# Patient Record
Sex: Male | Born: 1985 | Race: Black or African American | Hispanic: No | Marital: Single | Smoking: Current every day smoker
Health system: Southern US, Community
[De-identification: ages and names within clinical notes are randomized; demographics above are authoritative.]

---
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
## 2004-11-04 ENCOUNTER — Emergency Department (HOSPITAL_COMMUNITY): Admission: EM | Admit: 2004-11-04 | Discharge: 2004-11-04 | Payer: Self-pay | Admitting: Emergency Medicine

## 2004-11-22 ENCOUNTER — Emergency Department (HOSPITAL_COMMUNITY): Admission: EM | Admit: 2004-11-22 | Discharge: 2004-11-22 | Payer: Self-pay | Admitting: Emergency Medicine

## 2006-09-10 ENCOUNTER — Encounter: Payer: Self-pay | Admitting: Emergency Medicine

## 2006-09-10 ENCOUNTER — Inpatient Hospital Stay (HOSPITAL_COMMUNITY): Admission: EM | Admit: 2006-09-10 | Discharge: 2006-09-11 | Payer: Self-pay | Admitting: Emergency Medicine

## 2006-09-14 ENCOUNTER — Inpatient Hospital Stay (HOSPITAL_COMMUNITY): Admission: AD | Admit: 2006-09-14 | Discharge: 2006-09-17 | Payer: Self-pay | Admitting: Psychiatry

## 2006-09-14 ENCOUNTER — Ambulatory Visit: Payer: Self-pay | Admitting: Psychiatry

## 2006-09-15 ENCOUNTER — Ambulatory Visit (HOSPITAL_COMMUNITY): Admission: RE | Admit: 2006-09-15 | Discharge: 2006-09-15 | Payer: Self-pay | Admitting: Psychiatry

## 2006-11-28 ENCOUNTER — Emergency Department (HOSPITAL_COMMUNITY): Admission: EM | Admit: 2006-11-28 | Discharge: 2006-11-28 | Payer: Self-pay | Admitting: Emergency Medicine

## 2007-06-16 ENCOUNTER — Emergency Department (HOSPITAL_COMMUNITY): Admission: EM | Admit: 2007-06-16 | Discharge: 2007-06-16 | Payer: Self-pay | Admitting: Emergency Medicine

## 2008-04-11 ENCOUNTER — Emergency Department (HOSPITAL_COMMUNITY): Admission: EM | Admit: 2008-04-11 | Discharge: 2008-04-11 | Payer: Self-pay | Admitting: Emergency Medicine

## 2008-04-30 ENCOUNTER — Emergency Department: Payer: Self-pay | Admitting: Emergency Medicine

## 2008-10-12 ENCOUNTER — Emergency Department (HOSPITAL_COMMUNITY): Admission: EM | Admit: 2008-10-12 | Discharge: 2008-10-12 | Payer: Self-pay | Admitting: Emergency Medicine

## 2008-12-30 ENCOUNTER — Emergency Department (HOSPITAL_COMMUNITY): Admission: EM | Admit: 2008-12-30 | Discharge: 2008-12-30 | Payer: Self-pay | Admitting: Emergency Medicine

## 2009-10-28 ENCOUNTER — Emergency Department (HOSPITAL_COMMUNITY): Admission: EM | Admit: 2009-10-28 | Discharge: 2009-10-29 | Payer: Self-pay | Admitting: Emergency Medicine

## 2010-05-21 LAB — CULTURE, ROUTINE-ABSCESS

## 2010-05-21 LAB — RPR: RPR Ser Ql: NONREACTIVE

## 2010-05-21 LAB — HIV ANTIBODY (ROUTINE TESTING W REFLEX): HIV: NONREACTIVE

## 2010-06-22 ENCOUNTER — Emergency Department (HOSPITAL_COMMUNITY)
Admission: EM | Admit: 2010-06-22 | Discharge: 2010-06-22 | Disposition: A | Discharge status: Home / Self Care | Payer: Self-pay | Attending: Emergency Medicine | Admitting: Emergency Medicine

## 2010-06-22 DIAGNOSIS — Z23 Encounter for immunization: Secondary | ICD-10-CM | POA: Insufficient documentation

## 2010-06-22 DIAGNOSIS — IMO0001 Reserved for inherently not codable concepts without codable children: Secondary | ICD-10-CM | POA: Insufficient documentation

## 2010-06-22 DIAGNOSIS — L02619 Cutaneous abscess of unspecified foot: Secondary | ICD-10-CM | POA: Insufficient documentation
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
## 2010-12-01 LAB — CBC
HCT: 44.9
HCT: 45.3
Hemoglobin: 15
Hemoglobin: 14.9
MCHC: 33.3
MCHC: 32.7
MCHC: 32.8
MCV: 83.2
MCV: 84.1
MCV: 84.3
Platelets: 253
Platelets: 185
RBC: 5.4
RBC: 5.39
RDW: 12.6
RDW: 13.6
RDW: 13.8
WBC: 11.5 — ABNORMAL HIGH
WBC: 17.7 — ABNORMAL HIGH

## 2010-12-01 LAB — OPIATE, QUANTITATIVE, URINE
Codeine Urine: NEGATIVE
Hydrocodone: 850 ng/mL
Hydromorphone GC/MS Conf: 200 ng/mL
Morphine, Confirm: NEGATIVE
Oxycodone, ur: NEGATIVE

## 2010-12-01 LAB — COMPREHENSIVE METABOLIC PANEL
ALT: 15
AST: 22
Albumin: 3.9
Alkaline Phosphatase: 85
BUN: 12
CO2: 27
Calcium: 9.2
Chloride: 103
Creatinine, Ser: 1.28
GFR calc Af Amer: >60
GFR calc non Af Amer: >60
Glucose, Bld: 90
Potassium: 4.4
Sodium: 137
Total Bilirubin: 0.7
Total Protein: 7.8

## 2010-12-01 LAB — BASIC METABOLIC PANEL
BUN: 8
BUN: 8
CO2: 27
CO2: 28
Calcium: 8.7
Calcium: 8.9
Chloride: 104
Chloride: 106
Creatinine, Ser: 1.01
Creatinine, Ser: 1.02
GFR calc Af Amer: >60
GFR calc Af Amer: >60
GFR calc non Af Amer: >60
GFR calc non Af Amer: >60
Glucose, Bld: 87
Glucose, Bld: 89
Potassium: 4
Potassium: 4.4
Sodium: 138
Sodium: 138

## 2010-12-01 LAB — URINALYSIS, ROUTINE W REFLEX MICROSCOPIC
Bilirubin Urine: NEGATIVE
Glucose, UA: NEGATIVE
Hgb urine dipstick: NEGATIVE
Ketones, ur: NEGATIVE
Nitrite: NEGATIVE
Protein, ur: NEGATIVE
Specific Gravity, Urine: 1.027
Urobilinogen, UA: 0.2
pH: 6.5

## 2010-12-01 LAB — DRUGS OF ABUSE SCREEN W/O ALC, ROUTINE URINE
Amphetamine Screen, Ur: NEGATIVE
Barbiturate Quant, Ur: NEGATIVE
Benzodiazepines.: NEGATIVE
Cocaine Metabolites: NEGATIVE
Creatinine,U: 160.4
Marijuana Metabolite: POSITIVE — AB
Methadone: NEGATIVE
Opiate Screen, Urine: POSITIVE — AB
Phencyclidine (PCP): NEGATIVE
Propoxyphene: NEGATIVE

## 2010-12-01 LAB — PROTIME-INR
INR: 1
Prothrombin Time: 13.7

## 2010-12-01 LAB — URINE MICROSCOPIC-ADD ON

## 2010-12-01 LAB — TSH: TSH: 1.478

## 2010-12-01 LAB — THC (MARIJUANA), URINE, CONFIRMATION: Marijuana, Ur-Confirmation: 420 ng/mL

## 2010-12-01 LAB — MAGNESIUM: Magnesium: 2.3

## 2015-04-05 ENCOUNTER — Emergency Department (HOSPITAL_COMMUNITY): Payer: Self-pay

## 2015-04-05 ENCOUNTER — Encounter (HOSPITAL_COMMUNITY): Payer: Self-pay

## 2015-04-05 ENCOUNTER — Emergency Department (HOSPITAL_COMMUNITY)
Admission: EM | Admit: 2015-04-05 | Discharge: 2015-04-05 | Disposition: A | Discharge status: Home / Self Care | Payer: Self-pay | Attending: Emergency Medicine | Admitting: Emergency Medicine

## 2015-04-05 DIAGNOSIS — Y9389 Activity, other specified: Secondary | ICD-10-CM | POA: Insufficient documentation

## 2015-04-05 DIAGNOSIS — R55 Syncope and collapse: Secondary | ICD-10-CM | POA: Insufficient documentation

## 2015-04-05 DIAGNOSIS — S199XXA Unspecified injury of neck, initial encounter: Secondary | ICD-10-CM | POA: Insufficient documentation

## 2015-04-05 DIAGNOSIS — Y9289 Other specified places as the place of occurrence of the external cause: Secondary | ICD-10-CM | POA: Insufficient documentation

## 2015-04-05 DIAGNOSIS — S0219XA Other fracture of base of skull, initial encounter for closed fracture: Secondary | ICD-10-CM

## 2015-04-05 DIAGNOSIS — Y998 Other external cause status: Secondary | ICD-10-CM | POA: Insufficient documentation

## 2015-04-05 DIAGNOSIS — S0285XA Fracture of orbit, unspecified, initial encounter for closed fracture: Secondary | ICD-10-CM

## 2015-04-05 DIAGNOSIS — S0282XA Fracture of other specified skull and facial bones, left side, initial encounter for closed fracture: Secondary | ICD-10-CM | POA: Insufficient documentation

## 2015-04-05 DIAGNOSIS — F17200 Nicotine dependence, unspecified, uncomplicated: Secondary | ICD-10-CM | POA: Insufficient documentation

## 2015-04-05 LAB — COMPREHENSIVE METABOLIC PANEL
ALK PHOS: 73 U/L (ref 38–126)
ALT: 15 U/L — AB (ref 17–63)
AST: 24 U/L (ref 15–41)
Albumin: 4.1 g/dL (ref 3.5–5.0)
Anion gap: 8 (ref 5–15)
BILIRUBIN TOTAL: 0.3 mg/dL (ref 0.3–1.2)
BUN: 12 mg/dL (ref 6–20)
CALCIUM: 8.5 mg/dL — AB (ref 8.9–10.3)
CHLORIDE: 106 mmol/L (ref 101–111)
CO2: 26 mmol/L (ref 22–32)
CREATININE: 0.94 mg/dL (ref 0.61–1.24)
GFR calc Af Amer: >60 mL/min (ref >60)
Glucose, Bld: 82 mg/dL (ref 65–99)
Potassium: 4.2 mmol/L (ref 3.5–5.1)
Sodium: 140 mmol/L (ref 135–145)
Total Protein: 7.7 g/dL (ref 6.5–8.1)

## 2015-04-05 LAB — CBC WITH DIFFERENTIAL/PLATELET
Basophils Absolute: 0.1 10*3/uL (ref 0.0–0.1)
Basophils Relative: 0 %
EOS ABS: 0.4 10*3/uL (ref 0.0–0.7)
EOS PCT: 3 %
HCT: 45.8 % (ref 39.0–52.0)
Hemoglobin: 14.9 g/dL (ref 13.0–17.0)
LYMPHS ABS: 3.8 10*3/uL (ref 0.7–4.0)
Lymphocytes Relative: 24 %
MCH: 27.1 pg (ref 26.0–34.0)
MCHC: 32.5 g/dL (ref 30.0–36.0)
MCV: 83.3 fL (ref 78.0–100.0)
Monocytes Absolute: 1.7 10*3/uL — ABNORMAL HIGH (ref 0.1–1.0)
Monocytes Relative: 11 %
Neutro Abs: 9.6 10*3/uL — ABNORMAL HIGH (ref 1.7–7.7)
Neutrophils Relative %: 62 %
PLATELETS: 295 10*3/uL (ref 150–400)
RBC: 5.5 MIL/uL (ref 4.22–5.81)
RDW: 14 % (ref 11.5–15.5)
WBC: 15.6 10*3/uL — AB (ref 4.0–10.5)

## 2015-04-05 LAB — LIPASE, BLOOD: LIPASE: 22 U/L (ref 11–51)

## 2015-04-05 LAB — CBG MONITORING, ED: GLUCOSE-CAPILLARY: 92 mg/dL (ref 65–99)

## 2015-04-05 MED ORDER — CYCLOBENZAPRINE HCL 5 MG PO TABS: 5.0000 mg | ORAL_TABLET | Freq: Three times a day (TID) | ORAL | Status: DC | PRN

## 2015-04-05 MED ORDER — AMOXICILLIN 250 MG PO CAPS
500.0000 mg | ORAL_CAPSULE | Freq: Once | ORAL | Status: AC
  Administered 2015-04-05: 500 mg via ORAL
  Filled 2015-04-05: qty 2

## 2015-04-05 MED ORDER — TRAMADOL HCL 50 MG PO TABS: 100.0000 mg | ORAL_TABLET | Freq: Four times a day (QID) | ORAL | Status: DC | PRN

## 2015-04-05 MED ORDER — ACETAMINOPHEN 500 MG PO TABS
1000.0000 mg | ORAL_TABLET | Freq: Once | ORAL | Status: AC
  Administered 2015-04-05: 1000 mg via ORAL
  Filled 2015-04-05: qty 2

## 2015-04-05 MED ORDER — NAPROXEN 500 MG PO TABS: ORAL_TABLET | ORAL | Status: DC

## 2015-04-05 MED ORDER — AMOXICILLIN 500 MG PO CAPS: 500.0000 mg | ORAL_CAPSULE | Freq: Three times a day (TID) | ORAL | Status: DC

## 2015-04-05 NOTE — ED Notes (Signed)
Attempted to call pt residence. No answer. Pt reported wanted to call a cab. NT calling cab for patient at this time.
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
## 2015-04-05 NOTE — ED Notes (Signed)
Placed ice pack on patient's left eye. Patient tolerated well. Patient went back to sleep after applying ice pack.

## 2015-04-05 NOTE — ED Notes (Signed)
Lab at bedside

## 2015-04-05 NOTE — Discharge Instructions (Signed)
Use ice packs to the painful, bruised, or swollen areas. Take the antibiotic as written until gone. Take the other medications for pain and soreness. You need to follow-up with a ophthalmologist in the ears nose and throat physician. I gave you the names and numbers of the doctors on call tonight. Return to the emergency room if you have any problems listed on the head injury sheet, he starts struggling to breathe, he start having vomiting or vomiting blood, passed blood from your rectum, or get worsening chest or abdominal pain. The fracture around your eye extends from the fracture of year sinus in your forehead and is on the inner side of your eye towards her nose. The discharge instructions talk about a fracture on the floor of the orbit however your symptoms can be similar with getting double vision when you look to your left or right.
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
## 2016-05-22 ENCOUNTER — Emergency Department (HOSPITAL_COMMUNITY)
Admission: EM | Admit: 2016-05-22 | Discharge: 2016-05-22 | Disposition: A | Discharge status: Home / Self Care | Payer: Self-pay | Attending: Emergency Medicine | Admitting: Emergency Medicine

## 2016-05-22 ENCOUNTER — Encounter (HOSPITAL_COMMUNITY): Payer: Self-pay

## 2016-05-22 DIAGNOSIS — F17200 Nicotine dependence, unspecified, uncomplicated: Secondary | ICD-10-CM | POA: Insufficient documentation

## 2016-05-22 DIAGNOSIS — L0231 Cutaneous abscess of buttock: Secondary | ICD-10-CM | POA: Insufficient documentation

## 2016-05-22 DIAGNOSIS — Z79899 Other long term (current) drug therapy: Secondary | ICD-10-CM | POA: Insufficient documentation

## 2016-05-22 MED ORDER — POVIDONE-IODINE 10 % EX SOLN
CUTANEOUS | Status: AC
  Filled 2016-05-22: qty 118

## 2016-05-22 MED ORDER — SULFAMETHOXAZOLE-TRIMETHOPRIM 800-160 MG PO TABS: 1.0000 | ORAL_TABLET | Freq: Two times a day (BID) | ORAL | 0 refills | Status: AC

## 2016-05-22 MED ORDER — LIDOCAINE HCL (PF) 2 % IJ SOLN
INTRAMUSCULAR | Status: AC
  Filled 2016-05-22: qty 10

## 2016-05-22 MED ORDER — HYDROCODONE-ACETAMINOPHEN 5-325 MG PO TABS: ORAL_TABLET | ORAL | 0 refills | Status: DC

## 2016-05-22 NOTE — ED Triage Notes (Signed)
Pt reports he has a boil on right inner buttocks for 2 days. States he had one in same area a year ago. No drainage

## 2016-05-22 NOTE — Discharge Instructions (Signed)
Warm water soaks 2-3 times a day.  You can remove the packing in two days.  Return here if you do not feel comfortable removing it yourself or you are having any worsening symptoms.

## 2016-05-22 NOTE — ED Provider Notes (Signed)
[facility]-EMERGENCY DEPT Provider Note   CSN: [id number] Arrival date & time: 05/22/16  0809     History   Chief Complaint Chief Complaint  Patient presents with  . Abscess    HPI Jesse Reynolds is a 30 y.o. male.  HPI   Jesse Reynolds is a 31 y.o. male who presents to the Emergency Department complaining of a recurrent boil to the left upper buttock.  He complains of pain and swelling to the area for 2 days.  Pain worse with sitting.  He describes some intermittent drainage from the area.  Also reports having recurrent boils to his buttocks.  Pain worse with sitting.  He denies fever, chills, abdominal pain, vomiting, or changes and pain with defecation.   History reviewed. No pertinent past medical history.  There are no active problems to display for this patient.   History reviewed. No pertinent surgical history.     Home Medications    Prior to Admission medications   Medication Sig Start Date End Date Taking? Authorizing Provider  amoxicillin (AMOXIL) 500 MG capsule Take 1 capsule (500 mg total) by mouth 3 (three) times daily. 04/05/15   Devoria Albe, MD  cyclobenzaprine (FLEXERIL) 5 MG tablet Take 1 tablet (5 mg total) by mouth 3 (three) times daily as needed (muscle pain and soreness). 04/05/15   Devoria Albe, MD  naproxen (NAPROSYN) 500 MG tablet Take 1 po BID with food prn pain 04/05/15   Devoria Albe, MD  traMADol (ULTRAM) 50 MG tablet Take 2 tablets (100 mg total) by mouth every 6 (six) hours as needed. 04/05/15   Devoria Albe, MD    Family History History reviewed. No pertinent family history.  Social History Social History  Substance Use Topics  . Smoking status: Current Every Day Smoker  . Smokeless tobacco: Never Used  . Alcohol use Yes     Comment: occassional beer      Allergies   Patient has no known allergies.   Review of Systems Review of Systems  Constitutional: Negative for chills and fever.  Gastrointestinal: Negative for abdominal distention,  abdominal pain, nausea and vomiting.  Genitourinary: Negative for difficulty urinating and dysuria.  Musculoskeletal: Negative for arthralgias.  Skin: Positive for color change.       Pain, redness to left buttock  Hematological: Negative for adenopathy.  All other systems reviewed and are negative.    Physical Exam Updated Vital Signs BP 126/77 (BP Location: Right Arm)   Pulse 82   Temp 97.9 F (36.6 C) (Oral)   Resp 16   Ht 6' (1.829 m)   Wt 107.5 kg   SpO2 98%   BMI 32.14 kg/m   Physical Exam  Constitutional: He is oriented to person, place, and time. He appears well-developed and well-nourished. No distress.  HENT:  Head: Normocephalic and atraumatic.  Cardiovascular: Normal rate, regular rhythm and normal heart sounds.   No murmur heard. Pulmonary/Chest: Effort normal and breath sounds normal. No respiratory distress.  Abdominal: Soft. He exhibits no distension. There is no tenderness. There is no guarding.  Musculoskeletal: Normal range of motion.  Neurological: He is alert and oriented to person, place, and time. He exhibits normal muscle tone. Coordination normal.  Skin: Skin is warm and dry. There is erythema.  Focal area of fluctuance and erythema of the left upper buttock.  No drainage.    Nursing note and vitals reviewed.    ED Treatments / Results  Labs (all labs ordered are listed, but  only abnormal results are displayed) Labs Reviewed - No data to display  EKG  EKG Interpretation None       Radiology No results found.  Procedures Procedures (including critical care time)  INCISION AND DRAINAGE Performed by: Maxwell Caul. Consent: Verbal consent obtained. Risks and benefits: risks, benefits and alternatives were discussed Type: abscess  Body area: left upper buttock  Anesthesia: local infiltration  Incision was made with a #11 scalpel.  Local anesthetic: lidocaine 2 % w/o epinephrine  Anesthetic total: 3 ml  Complexity:  complex Blunt dissection to break up loculations  Drainage: purulent  Drainage amount: large  Packing material: 1/4 in iodoform gauze  Patient tolerance: Patient tolerated the procedure well with no immediate complications.   Medications Ordered in ED Medications - No data to display   Initial Impression / Assessment and Plan / ED Course  I have reviewed the triage vital signs and the nursing notes.  Pertinent labs & imaging results that were available during my care of the patient were reviewed by me and considered in my medical decision making (see chart for details).     Pt with recurrent abscess.  Pain improved after I&D.  He is otherwise well appearing and vitals stable. After care instructions given, including packing removal in 2 days.  Return precautions discussed.  Since this is a recurrent problem, I have also given referral for general surgery and pt prefers local provider.  rx for #8 hydrocodone and bactrim    Final Clinical Impressions(s) / ED Diagnoses   Final diagnoses:  Abscess of buttock, left    New Prescriptions New Prescriptions   No medications on file     Pauline Aus, Cordelia Poche 05/22/16 0959    Eber Hong, MD 05/23/16 [phone]

## 2016-10-10 ENCOUNTER — Encounter (HOSPITAL_COMMUNITY): Payer: Self-pay | Admitting: Emergency Medicine

## 2016-10-10 ENCOUNTER — Emergency Department (HOSPITAL_COMMUNITY)
Admission: EM | Admit: 2016-10-10 | Discharge: 2016-10-10 | Disposition: A | Discharge status: Home / Self Care | Payer: Self-pay | Attending: Emergency Medicine | Admitting: Emergency Medicine

## 2016-10-10 DIAGNOSIS — Z79899 Other long term (current) drug therapy: Secondary | ICD-10-CM | POA: Insufficient documentation

## 2016-10-10 DIAGNOSIS — F17200 Nicotine dependence, unspecified, uncomplicated: Secondary | ICD-10-CM | POA: Insufficient documentation

## 2016-10-10 DIAGNOSIS — L0231 Cutaneous abscess of buttock: Secondary | ICD-10-CM | POA: Insufficient documentation

## 2016-10-10 MED ORDER — LIDOCAINE-EPINEPHRINE (PF) 1 %-1:200000 IJ SOLN
INTRAMUSCULAR | Status: AC
  Filled 2016-10-10: qty 30

## 2016-10-10 MED ORDER — HYDROCODONE-ACETAMINOPHEN 5-325 MG PO TABS: 1.0000 | ORAL_TABLET | Freq: Four times a day (QID) | ORAL | 0 refills | Status: DC | PRN

## 2016-10-10 MED ORDER — IBUPROFEN 600 MG PO TABS: 600.0000 mg | ORAL_TABLET | Freq: Four times a day (QID) | ORAL | 0 refills | Status: DC | PRN

## 2016-10-10 MED ORDER — DOXYCYCLINE HYCLATE 100 MG PO CAPS: 100.0000 mg | ORAL_CAPSULE | Freq: Two times a day (BID) | ORAL | 0 refills | Status: DC

## 2016-10-10 MED ORDER — POVIDONE-IODINE 10 % EX SOLN
CUTANEOUS | Status: AC
  Filled 2016-10-10: qty 15

## 2016-10-10 MED ORDER — HYDROCODONE-ACETAMINOPHEN 5-325 MG PO TABS
1.0000 | ORAL_TABLET | Freq: Once | ORAL | Status: AC
  Administered 2016-10-10: 1 via ORAL
  Filled 2016-10-10: qty 1

## 2016-10-10 NOTE — ED Provider Notes (Signed)
[facility]-EMERGENCY DEPT Provider Note   CSN: [id number] Arrival date & time: 10/10/16  0556     History   Chief Complaint Chief Complaint  Patient presents with  . Abscess    HPI Jesse Reynolds is a 31 y.o. male.  The history is provided by the patient.  Abscess  Location:  Pelvis Pelvic abscess location:  L buttock Abscess quality: induration, painful and redness   Pain details:    Quality:  Pressure   Severity:  Moderate   Timing:  Constant   Progression:  Worsening Chronicity:  New Context: not diabetes   Relieved by:  Nothing Associated symptoms: no fever and no vomiting     PMH - none Home Medications    Prior to Admission medications   Medication Sig Start Date End Date Taking? Authorizing Provider  amoxicillin (AMOXIL) 500 MG capsule Take 1 capsule (500 mg total) by mouth 3 (three) times daily. 04/05/15   Devoria Albe, MD  cyclobenzaprine (FLEXERIL) 5 MG tablet Take 1 tablet (5 mg total) by mouth 3 (three) times daily as needed (muscle pain and soreness). 04/05/15   Devoria Albe, MD  HYDROcodone-acetaminophen (NORCO/VICODIN) 5-325 MG tablet Take one-two tabs po q 4-6 hrs prn pain 05/22/16   Triplett, Tammy, PA-C  naproxen (NAPROSYN) 500 MG tablet Take 1 po BID with food prn pain 04/05/15   Devoria Albe, MD  traMADol (ULTRAM) 50 MG tablet Take 2 tablets (100 mg total) by mouth every 6 (six) hours as needed. 04/05/15   Devoria Albe, MD    Family History No family history on file.  Social History Social History  Substance Use Topics  . Smoking status: Current Every Day Smoker  . Smokeless tobacco: Never Used  . Alcohol use Yes     Comment: occassional beer      Allergies   Patient has no known allergies.   Review of Systems Review of Systems  Constitutional: Negative for fever.  Gastrointestinal: Negative for vomiting.  Skin: Positive for color change.  All other systems reviewed and are negative.    Physical Exam Updated Vital Signs BP (!) 133/92 (BP  Location: Right Arm)   Pulse 87   Temp 98.4 F (36.9 C) (Oral)   Resp 17   Ht 1.829 m (6')   Wt 95.3 kg (210 lb)   SpO2 99%   BMI 28.48 kg/m   Physical Exam  CONSTITUTIONAL: Well developed/well nourished HEAD: Normocephalic/atraumatic EYES: EOMI ENMT: Mucous membranes moist NECK: supple no meningeal signs SPINE/BACK:entire spine nontender CV: S1/S2 noted, no murmurs/rubs/gallops noted LUNGS: Lungs are clear to auscultation bilaterally ABDOMEN: soft, nontender Buttock - left upper buttock, induration ,erythema, no crepitus or drainage.  No perirectal abscess noted.  Nurse Wilkie Aye present for exam GU:no cva tenderness, no scrotal tenderness/erythema - nurse Wilkie Aye present for exam NEURO: Pt is awake/alert/appropriate, moves all extremitiesx4. EXTREMITIES: pulses normal/equal SKIN: warm, color normal PSYCH: no abnormalities of mood noted, alert and oriented to situation  ED Treatments / Results  Labs (all labs ordered are listed, but only abnormal results are displayed) Labs Reviewed - No data to display  EKG  EKG Interpretation None       Radiology No results found.  Procedures Procedures  EMERGENCY DEPARTMENT US SOFT TISSUE INTERPRETATION "Study: Limited Soft Tissue Ultrasound"  INDICATIONS: Pain and Soft tissue infection Multiple views of the body part were obtained in real-time with a multi-frequency linear probe  PERFORMED BY: Myself IMAGES ARCHIVED?: Yes SIDE:Left BODY PART:buttock INTERPRETATION:  Abcess present  INCISION AND DRAINAGE Performed by: Joya Gaskins Consent: Verbal consent obtained. Risks and benefits: risks, benefits and alternatives were discussed Type: abscess  Body area: left buttock  Anesthesia: local infiltration  Incision was made with a scalpel.  Local anesthetic: lidocaine % with epinephrine  Anesthetic total: 3 ml  Complexity: complex Blunt dissection to break up loculations  Drainage: purulent  Drainage  amount: moderate  Patient tolerance: Patient tolerated the procedure well with no immediate complications.     Medications Ordered in ED Medications  povidone-iodine (BETADINE) 10 % external solution (not administered)  lidocaine-EPINEPHrine (XYLOCAINE-EPINEPHrine) 1 %-1:200000 (PF) injection (not administered)  HYDROcodone-acetaminophen (NORCO/VICODIN) 5-325 MG per tablet 1 tablet (1 tablet Oral Given 10/10/16 0640)     Initial Impression / Assessment and Plan / ED Course  I have reviewed the triage vital signs and the nursing notes.   Pt improved  Significant drainage from abscess Will d/c home  Final Clinical Impressions(s) / ED Diagnoses   Final diagnoses:  Abscess of buttock, left    New Prescriptions New Prescriptions   DOXYCYCLINE (VIBRAMYCIN) 100 MG CAPSULE    Take 1 capsule (100 mg total) by mouth 2 (two) times daily. One po bid x 7 days   HYDROCODONE-ACETAMINOPHEN (NORCO/VICODIN) 5-325 MG TABLET    Take 1 tablet by mouth every 6 (six) hours as needed for severe pain.   IBUPROFEN (ADVIL,MOTRIN) 600 MG TABLET    Take 1 tablet (600 mg total) by mouth every 6 (six) hours as needed.     Zadie Rhine, MD 10/10/16 [phone]

## 2016-10-10 NOTE — ED Triage Notes (Signed)
Pt c/o abscess to buttocks x 3 days that is reoccurring.

## 2016-11-21 ENCOUNTER — Encounter (HOSPITAL_COMMUNITY): Payer: Self-pay | Admitting: Emergency Medicine

## 2016-11-21 ENCOUNTER — Emergency Department (HOSPITAL_COMMUNITY)
Admission: EM | Admit: 2016-11-21 | Discharge: 2016-11-21 | Disposition: A | Discharge status: Home / Self Care | Payer: Self-pay | Attending: Emergency Medicine | Admitting: Emergency Medicine

## 2016-11-21 DIAGNOSIS — F17210 Nicotine dependence, cigarettes, uncomplicated: Secondary | ICD-10-CM | POA: Insufficient documentation

## 2016-11-21 DIAGNOSIS — K529 Noninfective gastroenteritis and colitis, unspecified: Secondary | ICD-10-CM | POA: Insufficient documentation

## 2016-11-21 DIAGNOSIS — E860 Dehydration: Secondary | ICD-10-CM | POA: Insufficient documentation

## 2016-11-21 MED ORDER — SODIUM CHLORIDE 0.9 % IV BOLUS (SEPSIS)
1000.0000 mL | Freq: Once | INTRAVENOUS | Status: AC
  Administered 2016-11-21: 1000 mL via INTRAVENOUS

## 2016-11-21 MED ORDER — KETOROLAC TROMETHAMINE 30 MG/ML IJ SOLN
30.0000 mg | Freq: Once | INTRAMUSCULAR | Status: AC
  Administered 2016-11-21: 30 mg via INTRAVENOUS
  Filled 2016-11-21: qty 1

## 2016-11-21 MED ORDER — ONDANSETRON 4 MG PO TBDP: 4.0000 mg | ORAL_TABLET | Freq: Three times a day (TID) | ORAL | 0 refills | Status: DC | PRN

## 2016-11-21 MED ORDER — ONDANSETRON HCL 4 MG/2ML IJ SOLN
4.0000 mg | Freq: Once | INTRAMUSCULAR | Status: AC
  Administered 2016-11-21: 4 mg via INTRAVENOUS
  Filled 2016-11-21: qty 2

## 2016-11-21 NOTE — ED Triage Notes (Signed)
Pt reports headache x 3 days with dizziness and vomiting.  States he has not been able to keep much down x 3 days and feels dehydrated.

## 2016-11-21 NOTE — Discharge Instructions (Signed)
Please obtain all of your results from medical records or have your doctors office obtain the results - share them with your doctor - you should be seen at your doctors office in the next 2 days. Call today to arrange your follow up. Take the medications as prescribed. Please review all of the medicines and only take them if you do not have an allergy to them. Please be aware that if you are taking birth control pills, taking other prescriptions, ESPECIALLY ANTIBIOTICS may make the birth control ineffective - if this is the case, either do not engage in sexual activity or use alternative methods of birth control such as condoms until you have finished the medicine and your family doctor says it is OK to restart them. If you are on a blood thinner such as COUMADIN, be aware that any other medicine that you take may cause the coumadin to either work too much, or not enough - you should have your coumadin level rechecked in next 7 days if this is the case.  ?  It is also a possibility that you have an allergic reaction to any of the medicines that you have been prescribed - Everybody reacts differently to medications and while MOST people have no trouble with most medicines, you may have a reaction such as nausea, vomiting, rash, swelling, shortness of breath. If this is the case, please stop taking the medicine immediately and contact your physician.  ?  You should return to the ER if you develop severe or worsening symptoms.   [facility] Primary Care Doctor List    Kari Baars MD. Specialty: Pulmonary Disease Contact information: [address]  [address]  [place] [address]  [phone]   Syliva Overman, MD. Specialty: [facility] Medicine Contact information: [address]  [place] [address]  [phone]   Lilyan Punt, MD. Specialty: [facility] Medicine Contact information: [address]  [place] [address]  [phone]   Avon Gully, MD Specialty:  Internal Medicine Contact information: [address] [place] [address]  [phone]   Catalina Pizza, MD. Specialty: Internal Medicine Contact information: [address]  [place] [address]  [phone]    [facility] (Dr. Selena Batten) Specialty: Family Medicine Contact information: [address]  [place] [address]  [phone]   John Giovanni, MD. Specialty: [facility] Medicine Contact information: [address]  [address]  [place] [address]  [phone]   Carylon Perches, MD. Specialty: Internal Medicine Contact information: [address]  [address]  [place] [address]  [phone]    [facility]  [address] [place], [place] [address] [phone]  Services [facility] offers a variety of basic health services.  Services include but are not limited to: Blood pressure checks  Heart rate checks  Blood sugar checks  Urine analysis  Rapid strep tests  Pregnancy tests.  Health education and referrals  People needing more complex services will be directed to a physician online. Using these virtual visits, doctors can evaluate and prescribe medicine and treatments. There will be no medication on-site, though [facility] will help patients fill their prescriptions at little to no cost.   For More information please go to: [email or web address]   Zofran every 6 hours as needed for nausea Drink plenty of fluids.

## 2016-11-21 NOTE — ED Provider Notes (Signed)
[facility]-EM[facility] DEPT Provider Note   CSN: [id number] Arrival date & time: 11/21/16  1744     History   Chief Complaint Chief Complaint  Patient presents with  . Headache    HPI Jesse Reynolds is a 31 y.o. male.  HPI  The patient is an otherwise healthy 31 year old male who presents with 3 days of nausea vomiting and diarrhea. He reports it started with nausea and vomiting, no fevers or chills, then he developed some watery diarrhea which has since resolved within the last 12 hours. He continues to have nausea and vomiting when he tries to drink or eat. He has also developed a mild headache because of lack of intake. He denies chest pain abdominal pain back pain swelling rashes or throat or fevers. He has had no medications prior to arrival. His symptoms are persistent, nothing seems to make it better.  No sick contacts, no travel, no recent antibiotics.  Of note the patient had an abscess on his buttock approximately 5 weeks ago but did not get the antibiotics filled and states that it totally went away. He has no symptoms at this time.  History reviewed. No pertinent past medical history.  There are no active problems to display for this patient.   History reviewed. No pertinent surgical history.     Home Medications    Prior to Admission medications   Medication Sig Start Date End Date Taking? Authorizing Provider  ondansetron (ZOFRAN ODT) 4 MG disintegrating tablet Take 1 tablet (4 mg total) by mouth every 8 (eight) hours as needed for nausea. 11/21/16   Eber Hong, MD    Family History History reviewed. No pertinent family history.  Social History Social History  Substance Use Topics  . Smoking status: Current Every Day Smoker    Packs/day: 1.00    Types: Cigarettes  . Smokeless tobacco: Never Used  . Alcohol use Yes     Comment: occassional beer      Allergies   Patient has no known allergies.   Review of Systems Review of Systems  All other systems  reviewed and are negative.    Physical Exam Updated Vital Signs BP 124/86 (BP Location: Right Arm)   Pulse 72   Temp 98.8 F (37.1 C) (Oral)   Resp 18   Ht  (1.854 m)   SpO2 97%   Physical Exam  Constitutional: He appears well-developed and well-nourished. No distress.  HENT:  Head: Normocephalic and atraumatic.  Mouth/Throat: Oropharynx is clear and moist. No oropharyngeal exudate.  Eyes: Pupils are equal, round, and reactive to light. Conjunctivae and EOM are normal. Right eye exhibits no discharge. Left eye exhibits no discharge. No scleral icterus.  Neck: Normal range of motion. Neck supple. No JVD present. No thyromegaly present.  Cardiovascular: Normal rate, regular rhythm, normal heart sounds and intact distal pulses.  Exam reveals no gallop and no friction rub.   No murmur heard. Pulmonary/Chest: Effort normal and breath sounds normal. No respiratory distress. He has no wheezes. He has no rales.  Abdominal: Soft. Bowel sounds are normal. He exhibits no distension and no mass. There is no tenderness.  Musculoskeletal: Normal range of motion. He exhibits no edema or tenderness.  Lymphadenopathy:    He has no cervical adenopathy.  Neurological: He is alert. Coordination normal.  Skin: Skin is warm and dry. No rash noted. No erythema.  Psychiatric: He has a normal mood and affect. His behavior is normal.  Nursing note and vitals reviewed.  ED Treatments / Results  Labs (all labs ordered are listed, but only abnormal results are displayed) Labs Reviewed - No data to display   Radiology No results found.  Procedures Procedures (including critical care time)  Medications Ordered in ED Medications  ondansetron (ZOFRAN) injection 4 mg (4 mg Intravenous Given 11/21/16 1950)  ketorolac (TORADOL) 30 MG/ML injection 30 mg (30 mg Intravenous Given 11/21/16 1950)  sodium chloride 0.9 % bolus 1,000 mL (1,000 mLs Intravenous New Bag/Given 11/21/16 1948)     Initial  Impression / Assessment and Plan / ED Course  I have reviewed the triage vital signs and the nursing notes.  Pertinent labs & imaging results that were available during my care of the patient were reviewed by me and considered in my medical decision making (see chart for details).     The patient's exam is unremarkable with a nontender abdomen, clear heart and lung sounds and no tachycardia. He does appear slightly dehydrated and due to his headache he will likely benefit from antiemetics and IV fluids. Otherwise no indication for imaging or lab work is the patient is otherwise healthy with a gastrointestinal illness which seems transient.  Doubt surgical cause.  Improved with fluids and Zofran Tolerated PO withotu difficulty Stable for d/c.  Final Clinical Impressions(s) / ED Diagnoses   Final diagnoses:  Gastroenteritis  Dehydration    New Prescriptions New Prescriptions   ONDANSETRON (ZOFRAN ODT) 4 MG DISINTEGRATING TABLET    Take 1 tablet (4 mg total) by mouth every 8 (eight) hours as needed for nausea.     Eber Hong, MD 11/21/16 2041

## 2017-01-03 ENCOUNTER — Encounter (HOSPITAL_COMMUNITY): Payer: Self-pay | Admitting: *Deleted

## 2017-01-03 ENCOUNTER — Emergency Department (HOSPITAL_COMMUNITY)
Admission: EM | Admit: 2017-01-03 | Discharge: 2017-01-03 | Disposition: A | Discharge status: Home / Self Care | Payer: Self-pay | Attending: Emergency Medicine | Admitting: Emergency Medicine

## 2017-01-03 ENCOUNTER — Other Ambulatory Visit: Payer: Self-pay

## 2017-01-03 DIAGNOSIS — B349 Viral infection, unspecified: Secondary | ICD-10-CM | POA: Insufficient documentation

## 2017-01-03 DIAGNOSIS — F17210 Nicotine dependence, cigarettes, uncomplicated: Secondary | ICD-10-CM | POA: Insufficient documentation

## 2017-01-03 LAB — CBC WITH DIFFERENTIAL/PLATELET
Basophils Absolute: 0 10*3/uL (ref 0.0–0.1)
Basophils Relative: 0 %
EOS ABS: 0.2 10*3/uL (ref 0.0–0.7)
Eosinophils Relative: 1 %
HEMATOCRIT: 46.5 % (ref 39.0–52.0)
HEMOGLOBIN: 15 g/dL (ref 13.0–17.0)
LYMPHS ABS: 3.3 10*3/uL (ref 0.7–4.0)
LYMPHS PCT: 32 %
MCH: 26.8 pg (ref 26.0–34.0)
MCHC: 32.3 g/dL (ref 30.0–36.0)
MCV: 83.2 fL (ref 78.0–100.0)
Monocytes Absolute: 0.6 10*3/uL (ref 0.1–1.0)
Monocytes Relative: 6 %
NEUTROS ABS: 6.4 10*3/uL (ref 1.7–7.7)
NEUTROS PCT: 61 %
Platelets: 263 10*3/uL (ref 150–400)
RBC: 5.59 MIL/uL (ref 4.22–5.81)
RDW: 14.2 % (ref 11.5–15.5)
WBC: 10.5 10*3/uL (ref 4.0–10.5)

## 2017-01-03 LAB — COMPREHENSIVE METABOLIC PANEL
ALK PHOS: 74 U/L (ref 38–126)
ALT: 14 U/L — AB (ref 17–63)
AST: 20 U/L (ref 15–41)
Albumin: 4 g/dL (ref 3.5–5.0)
Anion gap: 6 (ref 5–15)
BILIRUBIN TOTAL: 0.6 mg/dL (ref 0.3–1.2)
BUN: 14 mg/dL (ref 6–20)
CALCIUM: 9 mg/dL (ref 8.9–10.3)
CO2: 27 mmol/L (ref 22–32)
CREATININE: 1.16 mg/dL (ref 0.61–1.24)
Chloride: 106 mmol/L (ref 101–111)
GFR calc Af Amer: >60 mL/min (ref >60)
GFR calc non Af Amer: >60 mL/min (ref >60)
GLUCOSE: 133 mg/dL — AB (ref 65–99)
Potassium: 4.1 mmol/L (ref 3.5–5.1)
SODIUM: 139 mmol/L (ref 135–145)
TOTAL PROTEIN: 7 g/dL (ref 6.5–8.1)

## 2017-01-03 MED ORDER — ONDANSETRON 4 MG PO TBDP: ORAL_TABLET | ORAL | 0 refills | Status: AC

## 2017-01-03 MED ORDER — IBUPROFEN 800 MG PO TABS: 800.0000 mg | ORAL_TABLET | Freq: Three times a day (TID) | ORAL | 0 refills | Status: DC | PRN

## 2017-01-03 MED ORDER — ONDANSETRON HCL 4 MG/2ML IJ SOLN
4.0000 mg | Freq: Once | INTRAMUSCULAR | Status: AC
  Administered 2017-01-03: 4 mg via INTRAVENOUS
  Filled 2017-01-03: qty 2

## 2017-01-03 MED ORDER — SODIUM CHLORIDE 0.9 % IV BOLUS (SEPSIS)
1000.0000 mL | Freq: Once | INTRAVENOUS | Status: AC
  Administered 2017-01-03: 1000 mL via INTRAVENOUS

## 2017-01-03 MED ORDER — KETOROLAC TROMETHAMINE 30 MG/ML IJ SOLN
30.0000 mg | Freq: Once | INTRAMUSCULAR | Status: AC
  Administered 2017-01-03: 30 mg via INTRAVENOUS
  Filled 2017-01-03: qty 1

## 2017-01-03 NOTE — Discharge Instructions (Signed)
Drink plenty of fluids.  Follow-up if not improving in a few days.  Rest at home 1-2 days

## 2017-01-03 NOTE — ED Provider Notes (Signed)
[facility] EMERGENCY DEPARTMENT Provider Note   CSN: [id number] Arrival date & time: 01/03/17  1920     History   Chief Complaint Chief Complaint  Patient presents with  . Emesis    HPI Jesse Reynolds is a 31 y.o. male.  Patient complains of headache mild cough nausea vomiting.   The history is provided by the patient. No language interpreter was used.  Emesis   This is a new problem. The current episode started 12 to 24 hours ago. The problem occurs 2 to 4 times per day. The problem has not changed since onset.The emesis has an appearance of stomach contents. There has been no fever. Associated symptoms include a fever and headaches. Pertinent negatives include no abdominal pain, no chills, no cough and no diarrhea. Risk factors: Unknown.    History reviewed. No pertinent past medical history.  There are no active problems to display for this patient.   History reviewed. No pertinent surgical history.     Home Medications    Prior to Admission medications   Medication Sig Start Date End Date Taking? Authorizing Provider  ibuprofen (ADVIL,MOTRIN) 800 MG tablet Take 1 tablet (800 mg total) every 8 (eight) hours as needed by mouth for headache or moderate pain. 01/03/17   Bethann BerkshireZammit, Shaneequa Bahner, MD  ondansetron (ZOFRAN ODT) 4 MG disintegrating tablet 4mg  ODT q4 hours prn nausea/vomit 01/03/17   Bethann BerkshireZammit, Shelli Portilla, MD    Family History History reviewed. No pertinent family history.  Social History Social History   Tobacco Use  . Smoking status: Current Every Day Smoker    Packs/day: 1.00    Types: Cigarettes  . Smokeless tobacco: Never Used  Substance Use Topics  . Alcohol use: Yes    Comment: occassional beer   . Drug use: Yes    Types: Marijuana     Allergies   Patient has no known allergies.   Review of Systems Review of Systems  Constitutional: Positive for fatigue and fever. Negative for appetite change and chills.  HENT: Negative for congestion, ear  discharge and sinus pressure.   Eyes: Negative for discharge.  Respiratory: Negative for cough.   Cardiovascular: Negative for chest pain.  Gastrointestinal: Positive for vomiting. Negative for abdominal pain and diarrhea.  Genitourinary: Negative for frequency and hematuria.  Musculoskeletal: Negative for back pain.  Skin: Negative for rash.  Neurological: Positive for headaches. Negative for seizures.  Psychiatric/Behavioral: Negative for hallucinations.     Physical Exam Updated Vital Signs BP (!) 152/83 (BP Location: Right Arm)   Pulse 98   Temp 97.6 F (36.4 C) (Oral)   Resp 15   Ht 6\' 1"  (1.854 m)   Wt 104.3 kg (230 lb)   SpO2 98%   BMI 30.34 kg/m   Physical Exam  Constitutional: He is oriented to person, place, and time. He appears well-developed.  HENT:  Head: Normocephalic.  Neck supple  Eyes: Conjunctivae and EOM are normal. No scleral icterus.  Neck: Neck supple. No thyromegaly present.  Cardiovascular: Normal rate and regular rhythm. Exam reveals no gallop and no friction rub.  No murmur heard. Pulmonary/Chest: No stridor. He has no wheezes. He has no rales. He exhibits no tenderness.  Abdominal: He exhibits no distension. There is no tenderness. There is no rebound.  Musculoskeletal: Normal range of motion. He exhibits no edema.  Lymphadenopathy:    He has no cervical adenopathy.  Neurological: He is oriented to person, place, and time. He exhibits normal muscle tone. Coordination normal.  Skin: No rash noted. No erythema.  Psychiatric: He has a normal mood and affect. His behavior is normal.  Nursing note and vitals reviewed.    ED Treatments / Results  Labs (all labs ordered are listed, but only abnormal results are displayed) Labs Reviewed  COMPREHENSIVE METABOLIC PANEL - Abnormal; Notable for the following components:      Result Value   Glucose, Bld 133 (*)    ALT 14 (*)    All other components within normal limits  CBC WITH  DIFFERENTIAL/PLATELET    EKG  EKG Interpretation None       Radiology No results found.  Procedures Procedures (including critical care time)  Medications Ordered in ED Medications  ketorolac (TORADOL) 30 MG/ML injection 30 mg (30 mg Intravenous Given 01/03/17 2025)  ondansetron (ZOFRAN) injection 4 mg (4 mg Intravenous Given 01/03/17 2025)  sodium chloride 0.9 % bolus 1,000 mL (1,000 mLs Intravenous New Bag/Given 01/03/17 2025)     Initial Impression / Assessment and Plan / ED Course  I have reviewed the triage vital signs and the nursing notes.  Pertinent labs & imaging results that were available during my care of the patient were reviewed by me and considered in my medical decision making (see chart for details).     Patient with viral syndrome.  He will be treated with Motrin Zofran and follow-up as needed  Final Clinical Impressions(s) / ED Diagnoses   Final diagnoses:  Viral syndrome    ED Discharge Orders        Ordered    ondansetron (ZOFRAN ODT) 4 MG disintegrating tablet     01/03/17 2118    ibuprofen (ADVIL,MOTRIN) 800 MG tablet  Every 8 hours PRN     01/03/17 2118       Bethann BerkshireZammit, Maynor Mwangi, MD 01/03/17 2122

## 2017-01-03 NOTE — ED Triage Notes (Addendum)
Pt reports headache, n/v, dizziness, and chest pain when he coughs. Pt also reporting that he is unable to keep anything down x 2 days.

## 2019-01-07 ENCOUNTER — Emergency Department (HOSPITAL_COMMUNITY)
Admission: EM | Admit: 2019-01-07 | Discharge: 2019-01-07 | Disposition: A | Discharge status: Home / Self Care | Payer: Self-pay | Attending: Emergency Medicine | Admitting: Emergency Medicine

## 2019-01-07 ENCOUNTER — Other Ambulatory Visit: Payer: Self-pay

## 2019-01-07 ENCOUNTER — Emergency Department (HOSPITAL_COMMUNITY): Payer: Self-pay

## 2019-01-07 ENCOUNTER — Encounter (HOSPITAL_COMMUNITY): Payer: Self-pay

## 2019-01-07 DIAGNOSIS — M79602 Pain in left arm: Secondary | ICD-10-CM | POA: Insufficient documentation

## 2019-01-07 DIAGNOSIS — R200 Anesthesia of skin: Secondary | ICD-10-CM | POA: Insufficient documentation

## 2019-01-07 DIAGNOSIS — F17210 Nicotine dependence, cigarettes, uncomplicated: Secondary | ICD-10-CM | POA: Insufficient documentation

## 2019-01-07 LAB — URINALYSIS, ROUTINE W REFLEX MICROSCOPIC
Bilirubin Urine: NEGATIVE
Glucose, UA: NEGATIVE mg/dL
Hgb urine dipstick: NEGATIVE
Ketones, ur: NEGATIVE mg/dL
Leukocytes,Ua: NEGATIVE
Nitrite: NEGATIVE
Protein, ur: NEGATIVE mg/dL
Specific Gravity, Urine: 1.025 (ref 1.005–1.030)
pH: 5 (ref 5.0–8.0)

## 2019-01-07 LAB — CBC WITH DIFFERENTIAL/PLATELET
Abs Immature Granulocytes: 0.04 10*3/uL (ref 0.00–0.07)
Basophils Absolute: 0.1 10*3/uL (ref 0.0–0.1)
Basophils Relative: 1 %
Eosinophils Absolute: 0.3 10*3/uL (ref 0.0–0.5)
Eosinophils Relative: 3 %
HCT: 48.3 % (ref 39.0–52.0)
Hemoglobin: 15.4 g/dL (ref 13.0–17.0)
Immature Granulocytes: 0 %
Lymphocytes Relative: 41 %
Lymphs Abs: 4.5 10*3/uL — ABNORMAL HIGH (ref 0.7–4.0)
MCH: 27.2 pg (ref 26.0–34.0)
MCHC: 31.9 g/dL (ref 30.0–36.0)
MCV: 85.2 fL (ref 80.0–100.0)
Monocytes Absolute: 1.2 10*3/uL — ABNORMAL HIGH (ref 0.1–1.0)
Monocytes Relative: 11 %
Neutro Abs: 4.8 10*3/uL (ref 1.7–7.7)
Neutrophils Relative %: 44 %
Platelets: 285 10*3/uL (ref 150–400)
RBC: 5.67 MIL/uL (ref 4.22–5.81)
RDW: 12.9 % (ref 11.5–15.5)
WBC: 11 10*3/uL — ABNORMAL HIGH (ref 4.0–10.5)
nRBC: 0 % (ref 0.0–0.2)

## 2019-01-07 LAB — RAPID URINE DRUG SCREEN, HOSP PERFORMED
Amphetamines: NOT DETECTED
Barbiturates: NOT DETECTED
Benzodiazepines: NOT DETECTED
Cocaine: POSITIVE — AB
Opiates: NOT DETECTED
Tetrahydrocannabinol: POSITIVE — AB

## 2019-01-07 LAB — COMPREHENSIVE METABOLIC PANEL
ALT: 16 U/L (ref 0–44)
AST: 20 U/L (ref 15–41)
Albumin: 3.7 g/dL (ref 3.5–5.0)
Alkaline Phosphatase: 75 U/L (ref 38–126)
Anion gap: 9 (ref 5–15)
BUN: 20 mg/dL (ref 6–20)
CO2: 21 mmol/L — ABNORMAL LOW (ref 22–32)
Calcium: 9 mg/dL (ref 8.9–10.3)
Chloride: 105 mmol/L (ref 98–111)
Creatinine, Ser: 1.29 mg/dL — ABNORMAL HIGH (ref 0.61–1.24)
GFR calc Af Amer: >60 mL/min (ref >60)
GFR calc non Af Amer: >60 mL/min (ref >60)
Glucose, Bld: 94 mg/dL (ref 70–99)
Potassium: 4.7 mmol/L (ref 3.5–5.1)
Sodium: 135 mmol/L (ref 135–145)
Total Bilirubin: 0.8 mg/dL (ref 0.3–1.2)
Total Protein: 7.4 g/dL (ref 6.5–8.1)

## 2019-01-07 LAB — LIPASE, BLOOD: Lipase: 27 U/L (ref 11–51)

## 2019-01-07 NOTE — ED Provider Notes (Signed)
[facility]   CSN: [id number] Arrival date & time: 01/07/19  1519     History   Chief Complaint No chief complaint on file.   HPI Jesse Reynolds is a 33 y.o. male who presents today for evaluation of multiple complaints. His primary complaint is his left arm.  He reports that over the past 2 weeks he has had constant left arm pain with intermittent numbness.  He states that every 10 to 20 minutes he gets numbness and weakness that lasts for about 3 minutes.  He reports that the pain is constant.   The pain goes through his entire arm from the shoulder down and extends across the trapezius muscle.  He reports that he has continued pain up into his neck on the left side.  He currently denies any chest pain or shortness of breath.  He denies any injury.  He states that his mom read online that left arm symptoms can be a sign of a heart attack and he is very anxious that he may be having a heart attack or a blood clot.  He denies any injection drug use.  He is not on any hormones and has not taken any in over a year.  He denies a history of prior DVT/PE.         HPI  History reviewed. No pertinent past medical history.  There are no active problems to display for this patient.   History reviewed. No pertinent surgical history.      Home Medications    Prior to Admission medications   Medication Sig Start Date End Date Taking? Authorizing Provider  ibuprofen (ADVIL,MOTRIN) 800 MG tablet Take 1 tablet (800 mg total) every 8 (eight) hours as needed by mouth for headache or moderate pain. 01/03/17   Bethann BerkshireZammit, Joseph, MD  ondansetron (ZOFRAN ODT) 4 MG disintegrating tablet 4mg  ODT q4 hours prn nausea/vomit 01/03/17   Bethann BerkshireZammit, Joseph, MD    Family History No family history on file.  Social History Social History   Tobacco Use  . Smoking status: Current Every Day Smoker    Packs/day: 1.00    Types: Cigarettes  . Smokeless  tobacco: Never Used  Substance Use Topics  . Alcohol use: Yes    Comment: occassional beer   . Drug use: Yes    Types: Marijuana     Allergies   Patient has no known allergies.   Review of Systems Review of Systems  Constitutional: Negative for chills and fever.  HENT: Negative for congestion.   Respiratory: Negative for cough and shortness of breath.   Cardiovascular: Negative for chest pain.  Gastrointestinal: Negative for abdominal pain, nausea and vomiting.  Genitourinary: Positive for hematuria ("a few months ago.").  Musculoskeletal: Positive for neck pain. Negative for back pain.  Neurological: Positive for weakness and numbness. Negative for headaches.  Psychiatric/Behavioral: Negative for confusion.  All other systems reviewed and are negative.    Physical Exam Updated Vital Signs BP (!) 147/95   Pulse 65   Temp 99 F (37.2 C) (Oral)   Resp 15   SpO2 98%   Physical Exam Vitals signs and nursing note reviewed.  Constitutional:      General: He is not in acute distress.    Appearance: He is well-developed. He is not diaphoretic.  HENT:     Head: Normocephalic and atraumatic.  Eyes:     General: No scleral icterus.       Right  eye: No discharge.        Left eye: No discharge.     Conjunctiva/sclera: Conjunctivae normal.  Neck:     Musculoskeletal: Normal range of motion.  Cardiovascular:     Rate and Rhythm: Normal rate and regular rhythm.     Pulses: Normal pulses.     Heart sounds: Normal heart sounds.     Comments: Brisk capillary refill to fingers on left hand.  Left radial pulse 2+. Pulmonary:     Effort: Pulmonary effort is normal. No respiratory distress.     Breath sounds: Normal breath sounds. No stridor.  Abdominal:     General: There is no distension.  Musculoskeletal:        General: No deformity.     Comments: C-spine with diffuse midline and paraspinal muscle tenderness to palpation.  There is diffuse superior posterior shoulder  tenderness consistent with trapezius muscle spasm.  Left arm itself is soft, nontender, without crepitus or deformities.  Skin:    General: Skin is warm and dry.  Neurological:     Mental Status: He is alert.     Sensory: Sensory deficit (Patient reports unable to feel pinching on left arm on the ulnar aspect of the upper and lower arm.  Decreased sensation over the radial aspect however is able to tell touch versus no touch.  Entire hand has decreased sensation.) present.     Motor: No weakness (No pronator drift.  5/5 strength in bilateral arms and legs. ) or abnormal muscle tone.  Psychiatric:        Behavior: Behavior normal.      ED Treatments / Results  Labs (all labs ordered are listed, but only abnormal results are displayed) Labs Reviewed  COMPREHENSIVE METABOLIC PANEL - Abnormal; Notable for the following components:      Result Value   CO2 21 (*)    Creatinine, Ser 1.29 (*)    All other components within normal limits  CBC WITH DIFFERENTIAL/PLATELET - Abnormal; Notable for the following components:   WBC 11.0 (*)    Lymphs Abs 4.5 (*)    Monocytes Absolute 1.2 (*)    All other components within normal limits  RAPID URINE DRUG SCREEN, HOSP PERFORMED - Abnormal; Notable for the following components:   Cocaine POSITIVE (*)    Tetrahydrocannabinol POSITIVE (*)    All other components within normal limits  URINALYSIS, ROUTINE W REFLEX MICROSCOPIC  LIPASE, BLOOD    EKG None  Radiology Dg Chest 2 View  Result Date: 01/07/2019 CLINICAL DATA:  Left arm and back pain. EXAM: CHEST - 2 VIEW COMPARISON:  04/05/2015 FINDINGS: The heart size and mediastinal contours are within normal limits. Both lungs are clear. The visualized skeletal structures are unremarkable. IMPRESSION: No active cardiopulmonary disease. Electronically Signed   By: Constance Holster M.D.   On: 01/07/2019 17:21    Procedures Procedures (including critical care time)  Medications Ordered in ED  Medications - No data to display   Initial Impression / Assessment and Plan / ED Course  I have reviewed the triage vital signs and the nursing notes.  Pertinent labs & imaging results that were available during my care of the patient were reviewed by me and considered in my medical decision making (see chart for details).       Patient presents today for evaluation of 2 weeks of continued left arm pain and intermittent numbness with intermittent weakness.  On exam there is subjective numbness, however he denies any trauma.  Labs are obtained and reviewed, mild leukocytosis at 11 with lymphocytes of 4.5.  UDS is positive for cocaine and THC.  CMP is unremarkable.  EKG was obtained without evidence of ischemia.  Chest x-ray was obtained without acute abnormalities.  Arm is warm and well perfused, do not suspect a vascular issue.  Arm is not swollen, low suspicion for DVT.  No evidence of infection.  This patient was seen as a shared visit with Dr. Jodi Mourning.  Outpatient neurology referral placed.  Patient is also given information for the wellness clinic to follow-up with.  Given the intermittent nature of his symptoms do not suspect his symptoms are the result of a stroke, or other, possibly life-threatening, neurologic cause.  He does have generalized trapezius muscle spasm.  I recommended conservative care including stretching, range of motion, heat as this may be contributing to the symptoms.  Return precautions were discussed with patient who states their understanding.  At the time of discharge patient denied any unaddressed complaints or concerns.  Patient is agreeable for discharge home.  Final Clinical Impressions(s) / ED Diagnoses   Final diagnoses:  Left arm pain    ED Discharge Orders         Ordered    Ambulatory referral to Neurology    Comments: An appointment is requested in approximately: 2 weeks   01/07/19 1900           Norman Clay 01/07/19  2259    Blane Ohara, MD 01/07/19 2350    Blane Ohara, MD 01/07/19 2352

## 2019-01-07 NOTE — ED Triage Notes (Signed)
Patient complains of left arm pain and intermittent numbness x 2 weeks, reports some neck pain with same. Alert and oriented NAD

## 2019-01-07 NOTE — Discharge Instructions (Addendum)
Please schedule a follow-up appointment with community health and wellness.  They will see you even if you do not have insurance.  It is important that you follow-up on your lab work as your lymphocyte (a type of white blood cell) was slightly high today.  Your blood work also showed that you appear to be mildly dehydrated which can worsen muscle cramps and spasms.  Please make sure you are drinking plenty of water every day.  Recreational drugs such as cocaine or stimulants can cause increased stress on your heart.  Please do not use any of these drugs.  Please take Ibuprofen (Advil, motrin) and Tylenol (acetaminophen) to relieve your pain.  You may take up to 600 MG (3 pills) of normal strength ibuprofen every 8 hours as needed.  In between doses of ibuprofen you make take tylenol, up to 1,000 mg (two extra strength pills).  Do not take more than 3,000 mg tylenol in a 24 hour period.  Please check all medication labels as many medications such as pain and cold medications may contain tylenol.  Do not drink alcohol while taking these medications.  Do not take other NSAID'S while taking ibuprofen (such as aleve or naproxen).  Please take ibuprofen with food to decrease stomach upset.

## 2019-01-07 NOTE — ED Notes (Signed)
Patient verbalizes understanding of discharge instructions. Opportunity for questioning and answers were provided. Armband removed by staff, pt discharged from ED.  

## 2019-07-18 ENCOUNTER — Emergency Department (HOSPITAL_COMMUNITY)
Admission: EM | Admit: 2019-07-18 | Discharge: 2019-07-18 | Disposition: A | Discharge status: Home / Self Care | Payer: Self-pay | Attending: Emergency Medicine | Admitting: Emergency Medicine

## 2019-07-18 ENCOUNTER — Other Ambulatory Visit: Payer: Self-pay

## 2019-07-18 ENCOUNTER — Encounter (HOSPITAL_COMMUNITY): Payer: Self-pay | Admitting: Emergency Medicine

## 2019-07-18 DIAGNOSIS — R05 Cough: Secondary | ICD-10-CM | POA: Insufficient documentation

## 2019-07-18 DIAGNOSIS — R059 Cough, unspecified: Secondary | ICD-10-CM

## 2019-07-18 DIAGNOSIS — R0981 Nasal congestion: Secondary | ICD-10-CM | POA: Insufficient documentation

## 2019-07-18 DIAGNOSIS — F17210 Nicotine dependence, cigarettes, uncomplicated: Secondary | ICD-10-CM | POA: Insufficient documentation

## 2019-07-18 MED ORDER — FLUTICASONE PROPIONATE 50 MCG/ACT NA SUSP: 2.0000 | Freq: Every day | NASAL | 2 refills | Status: AC

## 2019-07-18 MED ORDER — BENZONATATE 100 MG PO CAPS: 100.0000 mg | ORAL_CAPSULE | Freq: Three times a day (TID) | ORAL | 0 refills | Status: AC

## 2019-07-18 NOTE — ED Notes (Signed)
Patient refused strep test.
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
## 2019-07-18 NOTE — ED Triage Notes (Signed)
C/o sore throat and "feeling hot for last 2 days.  Temp in triage 98.6.

## 2019-07-18 NOTE — Discharge Instructions (Signed)
General Viral Syndrome Care Instructions:  Your symptoms are likely consistent with a viral illness. Viruses do not require or respond to antibiotics. Treatment is symptomatic care and it is important to note that these symptoms may last for 7-14 days.   Hand washing: Wash your hands throughout the day, but especially before and after touching the face, using the restroom, sneezing, coughing, or touching surfaces that have been coughed or sneezed upon. Hydration: Symptoms of most illnesses will be intensified and complicated by dehydration. Dehydration can also extend the duration of symptoms. Drink plenty of fluids and get plenty of rest. You should be drinking at least half a liter of water an hour to stay hydrated. Electrolyte drinks (ex. Gatorade, Powerade, Pedialyte) are also encouraged. You should be drinking enough fluids to make your urine light yellow, almost clear. If this is not the case, you are not drinking enough water. Please note that some of the treatments indicated below will not be effective if you are not adequately hydrated. Diet: Please concentrate on hydration, however, you may introduce food slowly.  Start with a clear liquid diet, progressed to a full liquid diet, and then bland solids as you are able. Pain or fever: Ibuprofen, Naproxen, or acetaminophen (generic for Tylenol) for pain or fever.  Antiinflammatory medications: Take 600 mg of ibuprofen every 6 hours or 440 mg (over the counter dose) to 500 mg (prescription dose) of naproxen every 12 hours for the next 3 days. After this time, these medications may be used as needed for pain. Take these medications with food to avoid upset stomach. Choose only one of these medications, do not take them together. Acetaminophen (generic for Tylenol): Should you continue to have additional pain while taking the ibuprofen or naproxen, you may add in acetaminophen as needed. Your daily total maximum amount of acetaminophen from all sources  should be limited to 4000mg/day for persons without liver problems, or 2000mg/day for those with liver problems. Cough: Use the benzonatate (generic for Tessalon) for cough.  Teas, warm liquids, broths, and honey can also help with cough. Zyrtec or Claritin: May add these medication daily to control underlying symptoms of congestion, sneezing, and other signs of allergies.  These medications are available over-the-counter. Generics: Cetirizine (generic for Zyrtec) and loratadine (generic for Claritin). Fluticasone: Use fluticasone (generic for Flonase), as directed, for nasal and sinus congestion.  This medication is available over-the-counter. Congestion: Plain guaifenesin (generic for plain Mucinex) may help relieve congestion. Saline sinus rinses and saline nasal sprays may also help relieve congestion. If you do not have high blood pressure, heart problems, or an allergy to such medications, you may also try phenylephrine or Sudafed. Sore throat: Warm liquids or Chloraseptic spray may help soothe a sore throat. Gargle twice a day with a salt water solution made from a half teaspoon of salt in a cup of warm water.  Follow up: Follow up with a primary care provider within the next two weeks should symptoms fail to resolve. Return: Return to the ED for significantly worsening symptoms, shortness of breath, persistent vomiting, large amounts of blood in stool, or any other major concerns.  For prescription assistance, may try using prescription discount sites or apps, such as goodrx.com 

## 2019-11-10 ENCOUNTER — Emergency Department (HOSPITAL_COMMUNITY): Payer: No Typology Code available for payment source

## 2019-11-10 ENCOUNTER — Encounter (HOSPITAL_COMMUNITY): Payer: Self-pay | Admitting: Emergency Medicine

## 2019-11-10 ENCOUNTER — Emergency Department (HOSPITAL_COMMUNITY)
Admission: EM | Admit: 2019-11-10 | Discharge: 2019-11-10 | Disposition: A | Discharge status: Home / Self Care | Payer: No Typology Code available for payment source | Attending: Emergency Medicine | Admitting: Emergency Medicine

## 2019-11-10 ENCOUNTER — Other Ambulatory Visit: Payer: Self-pay

## 2019-11-10 DIAGNOSIS — R52 Pain, unspecified: Secondary | ICD-10-CM

## 2019-11-10 DIAGNOSIS — S20419A Abrasion of unspecified back wall of thorax, initial encounter: Secondary | ICD-10-CM | POA: Diagnosis not present

## 2019-11-10 DIAGNOSIS — F17210 Nicotine dependence, cigarettes, uncomplicated: Secondary | ICD-10-CM | POA: Insufficient documentation

## 2019-11-10 DIAGNOSIS — S60511A Abrasion of right hand, initial encounter: Secondary | ICD-10-CM | POA: Insufficient documentation

## 2019-11-10 DIAGNOSIS — M549 Dorsalgia, unspecified: Secondary | ICD-10-CM

## 2019-11-10 DIAGNOSIS — S30811A Abrasion of abdominal wall, initial encounter: Secondary | ICD-10-CM | POA: Diagnosis present

## 2019-11-10 DIAGNOSIS — T148XXA Other injury of unspecified body region, initial encounter: Secondary | ICD-10-CM

## 2019-11-10 LAB — CBC WITH DIFFERENTIAL/PLATELET
Abs Immature Granulocytes: 0.03 10*3/uL (ref 0.00–0.07)
Basophils Absolute: 0.1 10*3/uL (ref 0.0–0.1)
Basophils Relative: 1 %
Eosinophils Absolute: 0.2 10*3/uL (ref 0.0–0.5)
Eosinophils Relative: 2 %
HCT: 45.2 % (ref 39.0–52.0)
Hemoglobin: 14.2 g/dL (ref 13.0–17.0)
Immature Granulocytes: 0 %
Lymphocytes Relative: 29 %
Lymphs Abs: 3.2 10*3/uL (ref 0.7–4.0)
MCH: 27.2 pg (ref 26.0–34.0)
MCHC: 31.4 g/dL (ref 30.0–36.0)
MCV: 86.6 fL (ref 80.0–100.0)
Monocytes Absolute: 0.9 10*3/uL (ref 0.1–1.0)
Monocytes Relative: 9 %
Neutro Abs: 6.5 10*3/uL (ref 1.7–7.7)
Neutrophils Relative %: 59 %
Platelets: 263 10*3/uL (ref 150–400)
RBC: 5.22 MIL/uL (ref 4.22–5.81)
RDW: 13.6 % (ref 11.5–15.5)
WBC: 11 10*3/uL — ABNORMAL HIGH (ref 4.0–10.5)
nRBC: 0 % (ref 0.0–0.2)

## 2019-11-10 LAB — BASIC METABOLIC PANEL
Anion gap: 7 (ref 5–15)
BUN: 13 mg/dL (ref 6–20)
CO2: 27 mmol/L (ref 22–32)
Calcium: 9.2 mg/dL (ref 8.9–10.3)
Chloride: 102 mmol/L (ref 98–111)
Creatinine, Ser: 1.08 mg/dL (ref 0.61–1.24)
GFR calc Af Amer: >60 mL/min (ref >60)
GFR calc non Af Amer: >60 mL/min (ref >60)
Glucose, Bld: 105 mg/dL — ABNORMAL HIGH (ref 70–99)
Potassium: 4.1 mmol/L (ref 3.5–5.1)
Sodium: 136 mmol/L (ref 135–145)

## 2019-11-10 MED ORDER — METHOCARBAMOL 500 MG PO TABS: 500.0000 mg | ORAL_TABLET | Freq: Two times a day (BID) | ORAL | 0 refills | Status: AC

## 2019-11-10 MED ORDER — SODIUM CHLORIDE 0.9 % IV BOLUS
1000.0000 mL | Freq: Once | INTRAVENOUS | Status: AC
  Administered 2019-11-10: 21:00:00 1000 mL via INTRAVENOUS

## 2019-11-10 MED ORDER — ONDANSETRON HCL 4 MG/2ML IJ SOLN
4.0000 mg | Freq: Once | INTRAMUSCULAR | Status: AC
  Administered 2019-11-10: 21:00:00 4 mg via INTRAVENOUS
  Filled 2019-11-10: qty 2

## 2019-11-10 MED ORDER — BACITRACIN ZINC 500 UNIT/GM EX OINT
TOPICAL_OINTMENT | Freq: Once | CUTANEOUS | Status: AC
  Administered 2019-11-10: 1 via TOPICAL
  Filled 2019-11-10: qty 0.9

## 2019-11-10 MED ORDER — IOHEXOL 300 MG/ML  SOLN
100.0000 mL | Freq: Once | INTRAMUSCULAR | Status: AC | PRN
  Administered 2019-11-10: 100 mL via INTRAVENOUS

## 2019-11-10 MED ORDER — MORPHINE SULFATE (PF) 4 MG/ML IV SOLN
4.0000 mg | Freq: Once | INTRAVENOUS | Status: AC
  Administered 2019-11-10: 21:00:00 4 mg via INTRAVENOUS
  Filled 2019-11-10: qty 1

## 2019-11-10 MED ORDER — KETOROLAC TROMETHAMINE 30 MG/ML IJ SOLN
15.0000 mg | Freq: Once | INTRAMUSCULAR | Status: AC
  Administered 2019-11-10: 15 mg via INTRAVENOUS
  Filled 2019-11-10: qty 1
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
## 2019-11-10 NOTE — ED Notes (Signed)
Patient transported to CT 
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
## 2019-11-21 ENCOUNTER — Other Ambulatory Visit: Payer: Self-pay

## 2019-11-21 ENCOUNTER — Emergency Department (HOSPITAL_COMMUNITY)
Admission: EM | Admit: 2019-11-21 | Discharge: 2019-11-21 | Disposition: A | Discharge status: Home / Self Care | Payer: Self-pay | Attending: Emergency Medicine | Admitting: Emergency Medicine

## 2019-11-21 ENCOUNTER — Encounter (HOSPITAL_COMMUNITY): Payer: Self-pay

## 2019-11-21 DIAGNOSIS — W57XXXA Bitten or stung by nonvenomous insect and other nonvenomous arthropods, initial encounter: Secondary | ICD-10-CM | POA: Insufficient documentation

## 2019-11-21 DIAGNOSIS — S80862A Insect bite (nonvenomous), left lower leg, initial encounter: Secondary | ICD-10-CM | POA: Insufficient documentation

## 2019-11-21 DIAGNOSIS — S80861A Insect bite (nonvenomous), right lower leg, initial encounter: Secondary | ICD-10-CM | POA: Insufficient documentation

## 2019-11-21 DIAGNOSIS — F17210 Nicotine dependence, cigarettes, uncomplicated: Secondary | ICD-10-CM | POA: Insufficient documentation

## 2019-11-21 DIAGNOSIS — Y92009 Unspecified place in unspecified non-institutional (private) residence as the place of occurrence of the external cause: Secondary | ICD-10-CM | POA: Insufficient documentation

## 2019-11-21 MED ORDER — HYDROXYZINE HCL 25 MG PO TABS: 25.0000 mg | ORAL_TABLET | Freq: Four times a day (QID) | ORAL | 0 refills | Status: AC

## 2019-11-21 MED ORDER — HYDROXYZINE HCL 25 MG PO TABS
25.0000 mg | ORAL_TABLET | Freq: Once | ORAL | Status: AC
  Administered 2019-11-21: 25 mg via ORAL
  Filled 2019-11-21: qty 1

## 2019-11-21 NOTE — ED Triage Notes (Signed)
Pt reports that he stayed with a friend Sunday to do her hair and he had black bugs all over his socks and now he has bumps over his feet that has progressed up his legs and into trunk. Pt has been using topical ointments

## 2019-11-21 NOTE — ED Provider Notes (Signed)
[facility] EMERGENCY DEPARTMENT Provider Note   CSN: [id number] Arrival date & time: 11/21/19  0160     History Chief Complaint  Patient presents with  . Rash    Jesse Reynolds is a 34 y.o. male.  HPI    Patient presents with concern of pain, itchiness from multiple insect bites. He sustained to these 2 days ago, while at a friend's house.  On since that time he has had diffuse itchiness, discomfort throughout his lower extremities. Patient denies fever, vomiting, other complaints per He has tried accommodation of topical bleach, Benadryl, and oral antihistamines, with no relief. He is otherwise generally well, denies medical problems, takes no medication regularly. He does smoke, drinks occasionally.  History reviewed. No pertinent past medical history.  There are no problems to display for this patient.   History reviewed. No pertinent surgical history.     No family history on file.  Social History   Tobacco Use  . Smoking status: Current Every Day Smoker    Packs/day: 1.00    Types: Cigarettes  . Smokeless tobacco: Never Used  Vaping Use  . Vaping Use: Never used  Substance Use Topics  . Alcohol use: Not Currently    Comment: occassional beer   . Drug use: Yes    Types: Marijuana    Home Medications Prior to Admission medications   Medication Sig Start Date End Date Taking? Authorizing Provider  benzonatate (TESSALON) 100 MG capsule Take 1 capsule (100 mg total) by mouth every 8 (eight) hours. 07/18/19   Joy, Shawn C, PA-C  fluticasone (FLONASE) 50 MCG/ACT nasal spray Place 2 sprays into both nostrils daily. 07/18/19   Joy, Shawn C, PA-C  hydrOXYzine (ATARAX/VISTARIL) 25 MG tablet Take 1 tablet (25 mg total) by mouth every 6 (six) hours. 11/21/19   Gerhard Munch, MD  ibuprofen (ADVIL,MOTRIN) 800 MG tablet Take 1 tablet (800 mg total) every 8 (eight) hours as needed by mouth for headache or moderate pain. 01/03/17   Bethann Berkshire, MD  methocarbamol  (ROBAXIN) 500 MG tablet Take 1 tablet (500 mg total) by mouth 2 (two) times daily. 11/10/19   Maxwell Caul, PA-C  ondansetron (ZOFRAN ODT) 4 MG disintegrating tablet 4mg  ODT q4 hours prn nausea/vomit 01/03/17   01/05/17, MD    Allergies    Patient has no known allergies.  Review of Systems   Review of Systems  Constitutional: Negative for fever.  Respiratory: Negative for shortness of breath.   Cardiovascular: Negative for chest pain.  Musculoskeletal:       Negative aside from HPI  Skin:       Negative aside from HPI  Allergic/Immunologic: Negative for immunocompromised state.  Neurological: Negative for weakness.    Physical Exam Updated Vital Signs BP 120/83 (BP Location: Left Arm)   Pulse 83   Temp 98.2 F (36.8 C) (Oral)   Resp 20   SpO2 100%   Physical Exam Vitals and nursing note reviewed.  Constitutional:      General: He is not in acute distress.    Appearance: He is well-developed.  HENT:     Head: Normocephalic and atraumatic.  Eyes:     Conjunctiva/sclera: Conjunctivae normal.  Cardiovascular:     Rate and Rhythm: Normal rate and regular rhythm.     Pulses: Normal pulses.  Pulmonary:     Effort: Pulmonary effort is normal. No respiratory distress.     Breath sounds: No stridor.  Abdominal:     General:  There is no distension.  Skin:    General: Skin is warm and dry.     Comments: Innumerable minimally raised erythematous scattered lesions throughout the patient's lower extremities.  No confluent erythema, no urticarial changes.  Neurological:     Mental Status: He is alert and oriented to person, place, and time.     ED Results / Procedures / Treatments    Procedures Procedures (including critical care time)  Medications Ordered in ED Medications  hydrOXYzine (ATARAX/VISTARIL) tablet 25 mg (has no administration in time range)    ED Course  I have reviewed the triage vital signs and the nursing notes.  Pertinent labs & imaging  results that were available during my care of the patient were reviewed by me and considered in my medical decision making (see chart for details). Generally well adult male presents several days after sustaining multiple insect bites to his lower extremities purulence no evidence for concurrent cellulitis, substantial allergic reaction/anaphylaxis. No evidence for bacteremia, sepsis. Patient had adjustment in his regimen, was discharged in stable condition. Final Clinical Impression(s) / ED Diagnoses Final diagnoses:  Insect bites and stings, initial encounter    Rx / DC Orders ED Discharge Orders         Ordered    hydrOXYzine (ATARAX/VISTARIL) 25 MG tablet  Every 6 hours        11/21/19 0759           Gerhard Munch, MD 11/21/19 [phone]

## 2019-11-21 NOTE — Discharge Instructions (Signed)
As discussed, today's evaluation has been generally reassuring.  Recovery from sustaining multiple insect bites can take several days, possibly a week.  Please obtain and use the prescribed medication.  In addition, continue to use topical Benadryl, and obtain a Benadryl/insect bite pen from your local pharmacy for additional relief. Monitor your condition carefully and do not hesitate to return here for concerning changes.

## 2020-03-07 ENCOUNTER — Other Ambulatory Visit: Payer: Self-pay

## 2020-03-07 ENCOUNTER — Emergency Department (HOSPITAL_COMMUNITY): Admission: EM | Admit: 2020-03-07 | Discharge: 2020-03-07 | Discharge status: Against Medical Advice | Payer: Self-pay

## 2020-03-07 NOTE — ED Triage Notes (Signed)
Left prior to triage 

## 2020-06-26 ENCOUNTER — Encounter (HOSPITAL_COMMUNITY): Payer: Self-pay

## 2020-06-26 ENCOUNTER — Other Ambulatory Visit: Payer: Self-pay

## 2020-06-26 ENCOUNTER — Emergency Department (HOSPITAL_COMMUNITY)
Admission: EM | Admit: 2020-06-26 | Discharge: 2020-06-26 | Disposition: A | Discharge status: Home / Self Care | Payer: Self-pay | Attending: Emergency Medicine | Admitting: Emergency Medicine

## 2020-06-26 ENCOUNTER — Emergency Department (HOSPITAL_COMMUNITY): Payer: Self-pay

## 2020-06-26 DIAGNOSIS — F17210 Nicotine dependence, cigarettes, uncomplicated: Secondary | ICD-10-CM | POA: Insufficient documentation

## 2020-06-26 DIAGNOSIS — L0291 Cutaneous abscess, unspecified: Secondary | ICD-10-CM

## 2020-06-26 DIAGNOSIS — L0231 Cutaneous abscess of buttock: Secondary | ICD-10-CM | POA: Insufficient documentation

## 2020-06-26 DIAGNOSIS — R0789 Other chest pain: Secondary | ICD-10-CM | POA: Insufficient documentation

## 2020-06-26 DIAGNOSIS — R079 Chest pain, unspecified: Secondary | ICD-10-CM

## 2020-06-26 LAB — TROPONIN I (HIGH SENSITIVITY)
Troponin I (High Sensitivity): <2 ng/L (ref <18)
Troponin I (High Sensitivity): 2 ng/L (ref <18)

## 2020-06-26 LAB — CBC
HCT: 45.2 % (ref 39.0–52.0)
Hemoglobin: 14.1 g/dL (ref 13.0–17.0)
MCH: 27 pg (ref 26.0–34.0)
MCHC: 31.2 g/dL (ref 30.0–36.0)
MCV: 86.6 fL (ref 80.0–100.0)
Platelets: 309 10*3/uL (ref 150–400)
RBC: 5.22 MIL/uL (ref 4.22–5.81)
RDW: 13.7 % (ref 11.5–15.5)
WBC: 15.9 10*3/uL — ABNORMAL HIGH (ref 4.0–10.5)
nRBC: 0 % (ref 0.0–0.2)

## 2020-06-26 LAB — BASIC METABOLIC PANEL
Anion gap: 8 (ref 5–15)
BUN: 12 mg/dL (ref 6–20)
CO2: 25 mmol/L (ref 22–32)
Calcium: 8.6 mg/dL — ABNORMAL LOW (ref 8.9–10.3)
Chloride: 101 mmol/L (ref 98–111)
Creatinine, Ser: 0.98 mg/dL (ref 0.61–1.24)
GFR, Estimated: >60 mL/min (ref >60)
Glucose, Bld: 91 mg/dL (ref 70–99)
Potassium: 3.9 mmol/L (ref 3.5–5.1)
Sodium: 134 mmol/L — ABNORMAL LOW (ref 135–145)

## 2020-06-26 MED ORDER — SULFAMETHOXAZOLE-TRIMETHOPRIM 800-160 MG PO TABS: 1.0000 | ORAL_TABLET | Freq: Two times a day (BID) | ORAL | 0 refills | Status: AC

## 2020-06-26 NOTE — ED Triage Notes (Addendum)
Pt to er, pt states that he has been having chest pain off and on for the past year, states that he is here for the same.  Pt states that today the pain is worse and her supervisor made him come to the er.  States that nothing seems to make his pain better or worse.  Pt talking in full sentences, resps even and unlabored.  Pt laughing throughout the triage process.

## 2020-06-26 NOTE — ED Provider Notes (Signed)
[facility] EMERGENCY DEPARTMENT Provider Note   CSN: [id number] Arrival date & time: 06/26/20  1948     History Chief Complaint  Patient presents with  . Chest Pain    Jesse Reynolds is a 35 y.o. male.  HPI He complains of a sharp tight feeling in his left anterior chest, coming and going present for about a week.  He typically has this every other week and has been going on for about a year.  He has previously been evaluated for this in the emergency department and was told that he has "too much fluid."  He does not recall taking any medicine for it in the past.  He also complains of a sore of his left upper buttock, which has been present for several days and started draining this evening.  He states previously he has had this area lanced.  He denies fever, chills, nausea or vomiting.  He works a job, Engineer, structural.  There are no other known modifying factors.    History reviewed. No pertinent past medical history.  There are no problems to display for this patient.   History reviewed. No pertinent surgical history.     History reviewed. No pertinent family history.  Social History   Tobacco Use  . Smoking status: Current Every Day Smoker    Packs/day: 2.00    Types: Cigarettes  . Smokeless tobacco: Never Used  Vaping Use  . Vaping Use: Never used  Substance Use Topics  . Alcohol use: Yes    Comment: occassional beer   . Drug use: Yes    Types: Marijuana    Home Medications Prior to Admission medications   Medication Sig Start Date End Date Taking? Authorizing Provider  sulfamethoxazole-trimethoprim (BACTRIM DS) 800-160 MG tablet Take 1 tablet by mouth 2 (two) times daily. 06/26/20  Yes Mancel Bale, MD  benzonatate (TESSALON) 100 MG capsule Take 1 capsule (100 mg total) by mouth every 8 (eight) hours. 07/18/19   Joy, Shawn C, PA-C  fluticasone (FLONASE) 50 MCG/ACT nasal spray Place 2 sprays into both nostrils daily. 07/18/19   Joy, Shawn C, PA-C  hydrOXYzine  (ATARAX/VISTARIL) 25 MG tablet Take 1 tablet (25 mg total) by mouth every 6 (six) hours. 11/21/19   Gerhard Munch, MD  ibuprofen (ADVIL,MOTRIN) 800 MG tablet Take 1 tablet (800 mg total) every 8 (eight) hours as needed by mouth for headache or moderate pain. 01/03/17   Bethann Berkshire, MD  methocarbamol (ROBAXIN) 500 MG tablet Take 1 tablet (500 mg total) by mouth 2 (two) times daily. 11/10/19   Maxwell Caul, PA-C  ondansetron (ZOFRAN ODT) 4 MG disintegrating tablet 4mg  ODT q4 hours prn nausea/vomit 01/03/17   01/05/17, MD    Allergies    Patient has no known allergies.  Review of Systems   Review of Systems  All other systems reviewed and are negative.   Physical Exam Updated Vital Signs BP 114/69   Pulse 68   Temp 98.9 F (37.2 C) (Oral)   Resp 17   Ht 5\' 9"  (1.753 m)   Wt 86.2 kg   SpO2 100%   BMI 28.06 kg/m   Physical Exam Vitals and nursing note reviewed.  Constitutional:      General: He is not in acute distress.    Appearance: He is well-developed. He is not ill-appearing, toxic-appearing or diaphoretic.  HENT:     Head: Normocephalic and atraumatic.     Right Ear: External ear normal.  Left Ear: External ear normal.  Eyes:     Conjunctiva/sclera: Conjunctivae normal.     Pupils: Pupils are equal, round, and reactive to light.  Neck:     Trachea: Phonation normal.  Cardiovascular:     Rate and Rhythm: Normal rate and regular rhythm.     Heart sounds: Normal heart sounds.  Pulmonary:     Effort: Pulmonary effort is normal. No respiratory distress.     Breath sounds: Normal breath sounds. No stridor.  Chest:     Chest wall: No tenderness.  Abdominal:     Palpations: Abdomen is soft.     Tenderness: There is no abdominal tenderness.  Musculoskeletal:        General: Normal range of motion.     Cervical back: Normal range of motion and neck supple.  Skin:    General: Skin is warm and dry.     Comments: Small draining abscess, left upper  buttocks, draining serosanguineous fluid.  Area of induration beneath the draining wound about 1 cm.  This area is not fluctuant.  Neurological:     Mental Status: He is alert and oriented to person, place, and time.     Cranial Nerves: No cranial nerve deficit.     Sensory: No sensory deficit.     Motor: No abnormal muscle tone.     Coordination: Coordination normal.  Psychiatric:        Mood and Affect: Mood normal.        Behavior: Behavior normal.        Thought Content: Thought content normal.        Judgment: Judgment normal.     ED Results / Procedures / Treatments   Labs (all labs ordered are listed, but only abnormal results are displayed) Labs Reviewed  BASIC METABOLIC PANEL - Abnormal; Notable for the following components:      Result Value   Sodium 134 (*)    Calcium 8.6 (*)    All other components within normal limits  CBC - Abnormal; Notable for the following components:   WBC 15.9 (*)    All other components within normal limits  TROPONIN I (HIGH SENSITIVITY)  TROPONIN I (HIGH SENSITIVITY)    EKG EKG Interpretation  Date/Time:  Wednesday Jun 26 2020 19:56:47 EDT Ventricular Rate:  76 PR Interval:  162 QRS Duration: 84 QT Interval:  372 QTC Calculation: 418 R Axis:   70 Text Interpretation: Normal sinus rhythm Normal ECG since last tracing no significant change Confirmed by Mancel Bale [phone]) on 06/26/2020 8:44:10 PM   Radiology DG Chest 2 View  Result Date: 06/26/2020 CLINICAL DATA:  chest pain.  Two pack per day smoker. EXAM: CHEST - 2 VIEW COMPARISON:  11/10/2019 CT.  Chest radiograph 01/07/2027 reviewed. FINDINGS: Midline trachea. Normal heart size and mediastinal contours. No pleural effusion or pneumothorax. Upper lung predominant interstitial thickening is felt to be similar. No lobar consolidation. IMPRESSION: 1. No acute cardiopulmonary disease. 2. Mild peribronchial thickening which may relate to chronic bronchitis or smoking. Electronically  Signed   By: Jeronimo Greaves M.D.   On: 06/26/2020 20:32    Procedures Procedures   Medications Ordered in ED Medications - No data to display  ED Course  I have reviewed the triage vital signs and the nursing notes.  Pertinent labs & imaging results that were available during my care of the patient were reviewed by me and considered in my medical decision making (see chart for details).  MDM Rules/Calculators/A&P                           Patient Vitals for the past 24 hrs:  BP Temp Temp src Pulse Resp SpO2 Height Weight  06/26/20 2155 114/69 -- -- 68 17 100 % -- --  06/26/20 1958 123/85 98.9 F (37.2 C) Oral 79 20 100 % -- --  06/26/20 1952 -- -- -- -- -- -- 5\' 9"  (1.753 m) 86.2 kg    At time of discharge-reevaluation with update and discussion. After initial assessment and treatment, an updated evaluation reveals he is comfortable has no further complaints.   Medical Decision Making:  This patient is presenting for evaluation of nonspecific chest discomfort, which does require a range of treatment options, and is a complaint that involves a moderate risk of morbidity and mortality. The differential diagnoses include ACS, chest wall pain, nonspecific thoracic disorder. I decided to review old records, and in summary middle-aged male presenting with recurrent chest discomfort of nonspecific nature.  He has a secondary complaint of a draining abscess of his buttocks.  I did not require additional historical information from anyone.  Clinical Laboratory Tests Ordered, included CBC, Metabolic panel and Troponin. Review indicates normal except white count high, sodium low. Radiologic Tests Ordered, included chest x-ray.  I independently Visualized: Radiograph images, which show no infiltrate or edema  Cardiac Monitor Tracing which shows normal sinus rhythm    Critical Interventions-medical evaluation, laboratory testing, radiography, observation  reassessment  After These Interventions, the Patient was reevaluated and was found noncardiac type chest pain, and negative evaluation for acute cardiac disorder.  Secondary complaint of abscess which is draining, which will require ongoing management with warm soaks, and antibiotics.  Stable for outpatient management  CRITICAL CARE-no Performed by: Mancel Bale  Nursing Notes Reviewed/ Care Coordinated Applicable Imaging Reviewed Interpretation of Laboratory Data incorporated into ED treatment  The patient appears reasonably screened and/or stabilized for discharge and I doubt any other medical condition or other [facility] requiring further screening, evaluation, or treatment in the ED at this time prior to discharge.  Plan: Home Medications-use Tylenol for pain; Home Treatments-warm soaks for abscess drainage; return here if the recommended treatment, does not improve the symptoms; Recommended follow up-PCP, prn     Final Clinical Impression(s) / ED Diagnoses Final diagnoses:  Abscess  Nonspecific chest pain    Rx / DC Orders ED Discharge Orders         Ordered    sulfamethoxazole-trimethoprim (BACTRIM DS) 800-160 MG tablet  2 times daily        06/26/20 2247           08/26/20, MD 06/28/20 1255

## 2020-06-26 NOTE — ED Notes (Signed)
EKG done in triage

## 2020-06-26 NOTE — ED Notes (Signed)
Repeat trop collected by lab

## 2020-06-26 NOTE — ED Notes (Signed)
Pt states he also has abscess to sacral area he would like looked at.

## 2020-06-26 NOTE — Discharge Instructions (Signed)
Soak in warm tub 2 or 3 times a day to improve the drainage from the wound on your buttock.  For the chest discomfort use Tylenol every 4 hours.  Follow-up with the doctor of your choice if not better in 3 to 5 days.

## 2020-06-26 NOTE — ED Notes (Signed)
edp in room  

## 2020-07-01 ENCOUNTER — Emergency Department (HOSPITAL_COMMUNITY)
Admission: EM | Admit: 2020-07-01 | Discharge: 2020-07-01 | Disposition: A | Discharge status: Home / Self Care | Payer: Self-pay | Attending: Emergency Medicine | Admitting: Emergency Medicine

## 2020-07-01 ENCOUNTER — Other Ambulatory Visit: Payer: Self-pay

## 2020-07-01 ENCOUNTER — Emergency Department (HOSPITAL_COMMUNITY): Payer: Self-pay

## 2020-07-01 ENCOUNTER — Encounter (HOSPITAL_COMMUNITY): Payer: Self-pay | Admitting: *Deleted

## 2020-07-01 DIAGNOSIS — R0789 Other chest pain: Secondary | ICD-10-CM

## 2020-07-01 DIAGNOSIS — F17210 Nicotine dependence, cigarettes, uncomplicated: Secondary | ICD-10-CM | POA: Insufficient documentation

## 2020-07-01 DIAGNOSIS — R0602 Shortness of breath: Secondary | ICD-10-CM | POA: Insufficient documentation

## 2020-07-01 MED ORDER — NAPROXEN 500 MG PO TABS: 500.0000 mg | ORAL_TABLET | Freq: Two times a day (BID) | ORAL | 0 refills | Status: AC

## 2020-07-01 MED ORDER — ALBUTEROL SULFATE HFA 108 (90 BASE) MCG/ACT IN AERS
4.0000 | INHALATION_SPRAY | Freq: Once | RESPIRATORY_TRACT | Status: AC
  Administered 2020-07-01: 4 via RESPIRATORY_TRACT
  Filled 2020-07-01: qty 6.7

## 2020-07-01 NOTE — Discharge Instructions (Addendum)
I am prescribing a medication called naproxen.  This is an anti-inflammatory medication you can take 2 times per day for the next 2 weeks.  Please take this medication with a small amount of food to help prevent stomach irritation.  If your symptoms worsen, please come back to the emergency department for reevaluation.  It was a pleasure to meet you.

## 2020-07-01 NOTE — ED Provider Notes (Signed)
Emergency Medicine Provider Triage Evaluation Note  Jesse Reynolds , a 35 y.o. male  was evaluated in triage.  Pt complains of CP and SOB.  Review of Systems  Positive: Smoker, No active cough, no leg swelling Negative: Resolved buttocks abscess, no weakness  Physical Exam  BP (!) 136/97 (BP Location: Right Arm)   Pulse 91   Temp 98.4 F (36.9 C) (Oral)   Resp 17   Ht 5\' 9"  (1.753 m)   Wt 77.1 kg   SpO2 100%   BMI 25.10 kg/m  Gen:   Awake, no distress   Resp:  Normal effort . Decreased air mvt. With few wheezes MSK:   Moves extremities without difficulty  Other:  calm  Medical Decision Making  Medically screening exam initiated at 3:09 PM.  Appropriate orders placed.  Jesse Reynolds was informed that the remainder of the evaluation will be completed by another provider, this initial triage assessment does not replace that evaluation, and the importance of remaining in the ED until their evaluation is complete.  Albuterol inhaler, CXR, EKG ordered.   , MD 07/01/20 [phone]

## 2020-07-01 NOTE — ED Provider Notes (Signed)
  Face-to-face evaluation   History: He presents for evaluation of chest pain, ongoing, intermittent, without provocation or measures that improve it.  Evaluated previously for same by me, recently.  No ongoing cardiac history.  No fever, chills, productive cough, weakness, diaphoresis or paresthesia.  Physical exam: Middle-aged adult male, presenting, appearing comfortable.  No respiratory distress.  Lungs with diminished air movement bilaterally and few scattered wheezes.  No increased work of breathing.  Medical screening examination/treatment/procedure(s) were conducted as a shared visit with non-physician practitioner(s) and myself.  I personally evaluated the patient during the encounter    Mancel Bale, MD 07/02/20 [phone]

## 2020-07-01 NOTE — ED Triage Notes (Signed)
Pt with left sided CP for 2 weeks.  Seen for same on 5/11. Pt on cell phone while in triage.

## 2020-07-01 NOTE — ED Provider Notes (Signed)
[facility] EMERGENCY DEPARTMENT Provider Note   CSN: [id number] Arrival date & time: 07/01/20  1454     History Chief Complaint  Patient presents with  . Chest Pain    Jesse Reynolds is a 35 y.o. male.  HPI Patient is a 35 year old male who presents to the emergency department due to left-sided chest pain.  Patient states his symptoms started about 1 week ago.  They are intermittent.  Alleviate with rest and worsened with heavy lifting, movement, as well as palpation.  He states when his pain worsens he will sometimes get mild shortness of breath.  No nausea, vomiting, or diaphoresis.  States he smokes about 2 packs/day.  Denies any recent travel, immobilization, surgeries, cancer, history of blood clots, hemoptysis, leg swelling, recent oral hormone use.    History reviewed. No pertinent past medical history.  There are no problems to display for this patient.   History reviewed. No pertinent surgical history.     History reviewed. No pertinent family history.  Social History   Tobacco Use  . Smoking status: Current Every Day Smoker    Packs/day: 2.00    Types: Cigarettes  . Smokeless tobacco: Never Used  Vaping Use  . Vaping Use: Never used  Substance Use Topics  . Alcohol use: Yes    Comment: occassional beer   . Drug use: Yes    Types: Marijuana    Home Medications Prior to Admission medications   Medication Sig Start Date End Date Taking? Authorizing Provider  naproxen (NAPROSYN) 500 MG tablet Take 1 tablet (500 mg total) by mouth 2 (two) times daily with a meal. 07/01/20  Yes Vianka Ertel, PA-C  benzonatate (TESSALON) 100 MG capsule Take 1 capsule (100 mg total) by mouth every 8 (eight) hours. 07/18/19   Joy, Shawn C, PA-C  fluticasone (FLONASE) 50 MCG/ACT nasal spray Place 2 sprays into both nostrils daily. 07/18/19   Joy, Shawn C, PA-C  hydrOXYzine (ATARAX/VISTARIL) 25 MG tablet Take 1 tablet (25 mg total) by mouth every 6 (six) hours. 11/21/19    Gerhard Munch, MD  ibuprofen (ADVIL,MOTRIN) 800 MG tablet Take 1 tablet (800 mg total) every 8 (eight) hours as needed by mouth for headache or moderate pain. 01/03/17   Bethann Berkshire, MD  methocarbamol (ROBAXIN) 500 MG tablet Take 1 tablet (500 mg total) by mouth 2 (two) times daily. 11/10/19   Maxwell Caul, PA-C  ondansetron (ZOFRAN ODT) 4 MG disintegrating tablet 4mg  ODT q4 hours prn nausea/vomit 01/03/17   01/05/17, MD  sulfamethoxazole-trimethoprim (BACTRIM DS) 800-160 MG tablet Take 1 tablet by mouth 2 (two) times daily. 06/26/20   08/26/20, MD    Allergies    Patient has no known allergies.  Review of Systems   Review of Systems  All other systems reviewed and are negative. Ten systems reviewed and are negative for acute change, except as noted in the HPI.   Physical Exam Updated Vital Signs BP 123/80   Pulse 78   Temp 98.4 F (36.9 C) (Oral)   Resp 16   Ht 5\' 9"  (1.753 m)   Wt 77.1 kg   SpO2 100%   BMI 25.10 kg/m   Physical Exam Vitals and nursing note reviewed.  Constitutional:      General: He is not in acute distress.    Appearance: Normal appearance. He is well-developed and normal weight. He is not ill-appearing, toxic-appearing or diaphoretic.  HENT:     Head: Normocephalic and atraumatic.  Right Ear: External ear normal.     Left Ear: External ear normal.     Nose: Nose normal.     Mouth/Throat:     Mouth: Mucous membranes are moist.     Pharynx: Oropharynx is clear. No oropharyngeal exudate or posterior oropharyngeal erythema.  Eyes:     Extraocular Movements: Extraocular movements intact.  Cardiovascular:     Rate and Rhythm: Normal rate and regular rhythm.  No extrasystoles are present.    Chest Wall: PMI is not displaced.     Pulses: Normal pulses.          Radial pulses are 2+ on the right side and 2+ on the left side.       Dorsalis pedis pulses are 2+ on the right side and 2+ on the left side.     Heart sounds: Normal heart  sounds. Heart sounds not distant. No murmur heard.  No systolic murmur is present.  No diastolic murmur is present. No friction rub. No gallop. No S3 or S4 sounds.      Comments: Heart is regular rate and rhythm.  No murmurs, rubs, or gallops. Pulmonary:     Effort: Pulmonary effort is normal. No tachypnea, accessory muscle usage or respiratory distress.     Breath sounds: Normal breath sounds. No stridor. No decreased breath sounds, wheezing, rhonchi or rales.     Comments: Lungs are clear to auscultation bilaterally. Chest:     Chest wall: Tenderness present. No crepitus.     Comments: Moderate TTP noted to the left anterior chest wall.  No overlying skin changes.  No crepitus. Abdominal:     General: Abdomen is flat.     Palpations: Abdomen is soft.     Tenderness: There is no abdominal tenderness.  Musculoskeletal:        General: Normal range of motion.     Cervical back: Normal range of motion and neck supple. No tenderness.     Right lower leg: No tenderness. No edema.     Left lower leg: No tenderness. No edema.     Comments: No leg swelling or calf tenderness.  Skin:    General: Skin is warm and dry.  Neurological:     General: No focal deficit present.     Mental Status: He is alert and oriented to person, place, and time.  Psychiatric:        Mood and Affect: Mood normal.        Behavior: Behavior normal.    ED Results / Procedures / Treatments   Labs (all labs ordered are listed, but only abnormal results are displayed) Labs Reviewed - No data to display  EKG None  Radiology DG Chest 2 View  Result Date: 07/01/2020 CLINICAL DATA:  Chest pain for 2 weeks, history of tobacco use EXAM: CHEST - 2 VIEW COMPARISON:  06/26/2020 FINDINGS: Cardiac shadow is stable. Lungs are well aerated bilaterally. No focal infiltrate or sizable effusion is seen. No bony abnormality is noted. IMPRESSION: No acute abnormality noted. Electronically Signed   By: Alcide Clever M.D.   On:  07/01/2020 16:17   Procedures Procedures   Medications Ordered in ED Medications  albuterol (VENTOLIN HFA) 108 (90 Base) MCG/ACT inhaler 4 puff (4 puffs Inhalation Given 07/01/20 1702)   ED Course  I have reviewed the triage vital signs and the nursing notes.  Pertinent labs & imaging results that were available during my care of the patient were reviewed by me and considered  in my medical decision making (see chart for details).    MDM Rules/Calculators/A&P                          Pt is a 35 y.o. male who presents to the emergency department due to left-sided chest pain.  Imaging: Chest x-ray is negative.  I, Placido Sou, PA-C, personally reviewed and evaluated these images and lab results as part of my medical decision-making.  Patient's symptoms seem to be musculoskeletal in nature.  He has reproducible pain along the left side of the chest.  No overlying skin changes, masses, or crepitus.  Doubt infectious etiology.  With further discussion patient notes that he has a strenuous job doing heavy lifting and states that this will significantly worsen his pain.  States it feels as if he has a pulling sensation such as a muscle spasm.  EKG showed normal sinus rhythm.  Chest x-ray was negative.  Given his age, reassuring ECG, and reassuring physical exam, doubt ACS.  He was seen 5 days ago in the emergency department with similar complaints and had reassuring troponins at that time as well.  Patient is also PERC negative.  Doubt PE.   Will discharge patient on a course of naproxen.  Discussed return precautions at length.  Feel that he is stable for discharge at this time and he is agreeable.  His questions were answered and he was amicable at the time of discharge.  Note: Portions of this report may have been transcribed using voice recognition software. Every effort was made to ensure accuracy; however, inadvertent computerized transcription errors may be present.   Final  Clinical Impression(s) / ED Diagnoses Final diagnoses:  Chest wall pain   Rx / DC Orders ED Discharge Orders         Ordered    naproxen (NAPROSYN) 500 MG tablet  2 times daily with meals        07/01/20 1729           Placido Sou, PA-C 07/01/20 1735    Mancel Bale, MD 07/02/20 0011

## 2021-04-23 ENCOUNTER — Other Ambulatory Visit: Payer: Self-pay

## 2021-04-23 ENCOUNTER — Emergency Department (HOSPITAL_COMMUNITY)
Admission: EM | Admit: 2021-04-23 | Discharge: 2021-04-23 | Disposition: A | Discharge status: Home / Self Care | Payer: Self-pay | Attending: Emergency Medicine | Admitting: Emergency Medicine

## 2021-04-23 ENCOUNTER — Encounter (HOSPITAL_COMMUNITY): Payer: Self-pay

## 2021-04-23 DIAGNOSIS — K029 Dental caries, unspecified: Secondary | ICD-10-CM | POA: Insufficient documentation

## 2021-04-23 MED ORDER — IBUPROFEN 400 MG PO TABS
600.0000 mg | ORAL_TABLET | Freq: Once | ORAL | Status: AC
  Administered 2021-04-23: 600 mg via ORAL
  Filled 2021-04-23: qty 2

## 2021-04-23 MED ORDER — IBUPROFEN 600 MG PO TABS: 600.0000 mg | ORAL_TABLET | Freq: Four times a day (QID) | ORAL | 0 refills | Status: AC | PRN

## 2021-04-23 MED ORDER — AMOXICILLIN-POT CLAVULANATE 875-125 MG PO TABS: 1.0000 | ORAL_TABLET | Freq: Two times a day (BID) | ORAL | 0 refills | Status: AC

## 2021-04-23 MED ORDER — IBUPROFEN 400 MG PO TABS
ORAL_TABLET | ORAL | Status: AC
  Filled 2021-04-23: qty 1

## 2021-04-23 NOTE — ED Triage Notes (Signed)
Patient with complaints of dental pain then facial swelling that started the previous night.  ?

## 2021-04-23 NOTE — ED Provider Notes (Signed)
?[facility] EMERGENCY DEPARTMENT ?Provider Note ? ? ?CSN: [id number] ?Arrival date & time: 04/23/21  0909 ? ?  ? ?History ? ?Chief Complaint  ?Patient presents with  ? Dental Pain  ? ? ?Jesse Reynolds is a 36 y.o. male presenting with right-sided dental pain and inflammation that began last night.  He reports that this is happened before however he did not take the antibiotics regularly.  Reports he has never seen a dentist.  Denies any trismus, difficulty breathing, fever or chills.  Denies a change in his voice.  Reports it is painful to chew on his right side however no other complaints.  States that he tried Orajel, warm compresses and another liquid medications that he was unable to remember.  Says that this is not fully helping and it seems to be worse this morning than it was last night. ? ?Home Medications ?Prior to Admission medications   ?Medication Sig Start Date End Date Taking? Authorizing Provider  ?amoxicillin-clavulanate (AUGMENTIN) 875-125 MG tablet Take 1 tablet by mouth every 12 (twelve) hours. 04/23/21  Yes Needham Biggins A, PA-C  ?ibuprofen (ADVIL) 600 MG tablet Take 1 tablet (600 mg total) by mouth every 6 (six) hours as needed. 04/23/21  Yes Daxton Nydam A, PA-C  ?benzonatate (TESSALON) 100 MG capsule Take 1 capsule (100 mg total) by mouth every 8 (eight) hours. 07/18/19   Joy, Shawn C, PA-C  ?fluticasone (FLONASE) 50 MCG/ACT nasal spray Place 2 sprays into both nostrils daily. 07/18/19   Joy, Shawn C, PA-C  ?hydrOXYzine (ATARAX/VISTARIL) 25 MG tablet Take 1 tablet (25 mg total) by mouth every 6 (six) hours. 11/21/19   Gerhard Munch, MD  ?methocarbamol (ROBAXIN) 500 MG tablet Take 1 tablet (500 mg total) by mouth 2 (two) times daily. 11/10/19   Maxwell Caul, PA-C  ?naproxen (NAPROSYN) 500 MG tablet Take 1 tablet (500 mg total) by mouth 2 (two) times daily with a meal. 07/01/20   Placido Sou, PA-C  ?ondansetron (ZOFRAN ODT) 4 MG disintegrating tablet 4mg  ODT q4 hours prn nausea/vomit  01/03/17   01/05/17, MD  ?sulfamethoxazole-trimethoprim (BACTRIM DS) 800-160 MG tablet Take 1 tablet by mouth 2 (two) times daily. 06/26/20   08/26/20, MD  ?   ? ?Allergies    ?Patient has no known allergies.   ? ?Review of Systems   ?Review of Systems ?See HPI ?Physical Exam ?Updated Vital Signs ?BP 131/87 (BP Location: Right Arm)   Pulse 74   Temp 98.4 ?F (36.9 ?C) (Oral)   Resp 20   Ht 6\' 1"  (1.854 m)   Wt 83.9 kg   SpO2 100%   BMI 24.41 kg/m?  ?Physical Exam ?Vitals and nursing note reviewed.  ?Constitutional:   ?   Appearance: Normal appearance. He is not ill-appearing.  ?HENT:  ?   Head: Normocephalic and atraumatic.  ?   Jaw: Swelling present. No trismus.  ?   Right Ear: Tympanic membrane normal.  ?   Left Ear: Tympanic membrane normal.  ?   Nose: Nose normal.  ?   Mouth/Throat:  ?   Mouth: Mucous membranes are moist.  ?   Dentition: Dental caries present.  ?   Pharynx: Oropharynx is clear. Uvula midline. No pharyngeal swelling or posterior oropharyngeal erythema.  ?   Comments: Nearly every 2 days with dental caries.  Some associated gingivitis on the right lower tooth line.  No signs of dental abscess or PTA. Some soft tissue swelling throughout patient's right face.  No ear involvement.  No trismus. ? ?Eyes:  ?   General: No scleral icterus. ?   Conjunctiva/sclera: Conjunctivae normal.  ?Pulmonary:  ?   Effort: Pulmonary effort is normal. No respiratory distress.  ?Skin: ?   Findings: No rash.  ?Neurological:  ?   Mental Status: He is alert.  ?Psychiatric:     ?   Mood and Affect: Mood normal.  ? ? ?ED Results / Procedures / Treatments   ?Labs ?(all labs ordered are listed, but only abnormal results are displayed) ?Labs Reviewed - No data to display ? ?EKG ?None ? ?Radiology ?No results found. ? ?Procedures ?Procedures  ? ? ?Medications Ordered in ED ?Medications  ?ibuprofen (ADVIL) 400 MG tablet (  Not Given 04/23/21 0944)  ?ibuprofen (ADVIL) tablet 600 mg (600 mg Oral Given 04/23/21 0938)   ? ? ?ED Course/ Medical Decision Making/ A&P ?  ?                        ?Medical Decision Making ?Risk ?Prescription drug management. ? ? ?36 year old male presenting with right-sided dental pain.  Teeth and poor dentition, has never seen a dentist.  No signs of abscess on physical exam.  Airway clear, no signs of PTA or trismus. ? ?Treatment: Patient given a dose of ibuprofen today.  Will be discharged on ibuprofen and Augmentin.  Proper use of these medications was discussed. ? ?Given resources for dentist. ?Final Clinical Impression(s) / ED Diagnoses ?Final diagnoses:  ?Pain due to dental caries  ? ? ?Rx / DC Orders ?ED Discharge Orders   ? ?      Ordered  ?  amoxicillin-clavulanate (AUGMENTIN) 875-125 MG tablet  Every 12 hours       ? 04/23/21 0933  ?  ibuprofen (ADVIL) 600 MG tablet  Every 6 hours PRN       ? 04/23/21 0933  ? ?  ?  ? ?  ? ?Results and diagnoses were explained to the patient. Return precautions discussed in full. Patient had no additional questions and expressed complete understanding. ? ? ?This chart was dictated using voice recognition software.  Despite best efforts to proofread,  errors can occur which can change the documentation meaning.  ?  ?Saddie Benders, PA-C ?04/23/21 1052 ? ?  ?Jacalyn Lefevre, MD ?04/23/21 1126 ? ?

## 2021-04-23 NOTE — Discharge Instructions (Addendum)
Please take all of the medications as prescribed to you.  This is the only way to clear up your dental infection.  Ibuprofen will help with your inflammation and pain.  Do not take naproxen, BC, aspirin or Aleve with the ibuprofen.  You may use Tylenol as well. Cold packs will be better than warm. ? ?There is a list of potential dentist that may be able to help you attached to these papers.  There is also an oral surgeon named Dr. Haig Prophet where that you may call for an appointment as well. ?

## 2021-09-29 IMAGING — DX DG CHEST 2V
2 series · 2 of 2 positions shown · non-contrast
Comparison: 04/05/2015

CLINICAL DATA: Left arm and back pain.

EXAM:
CHEST - 2 VIEW

[chest pa]
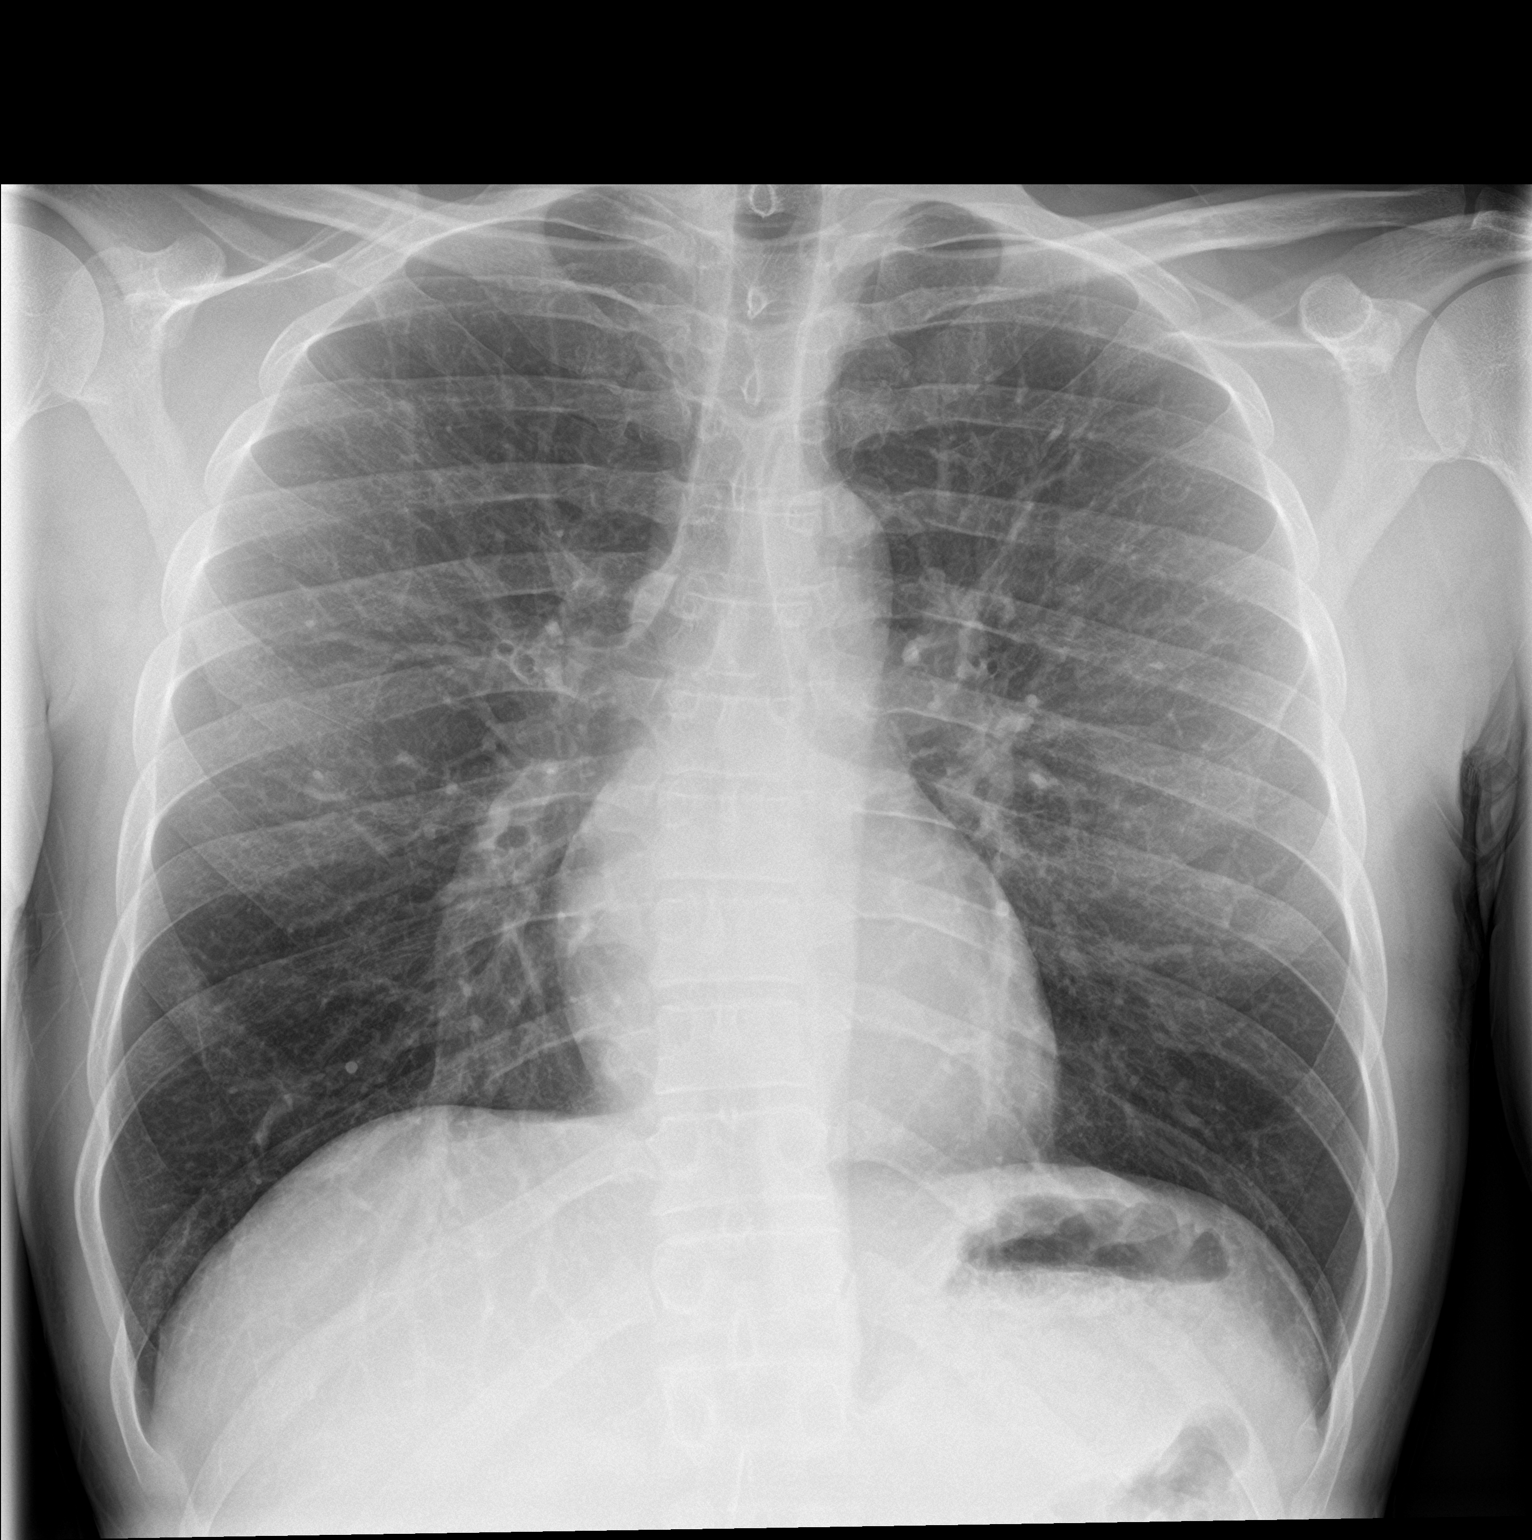

[chest lat]
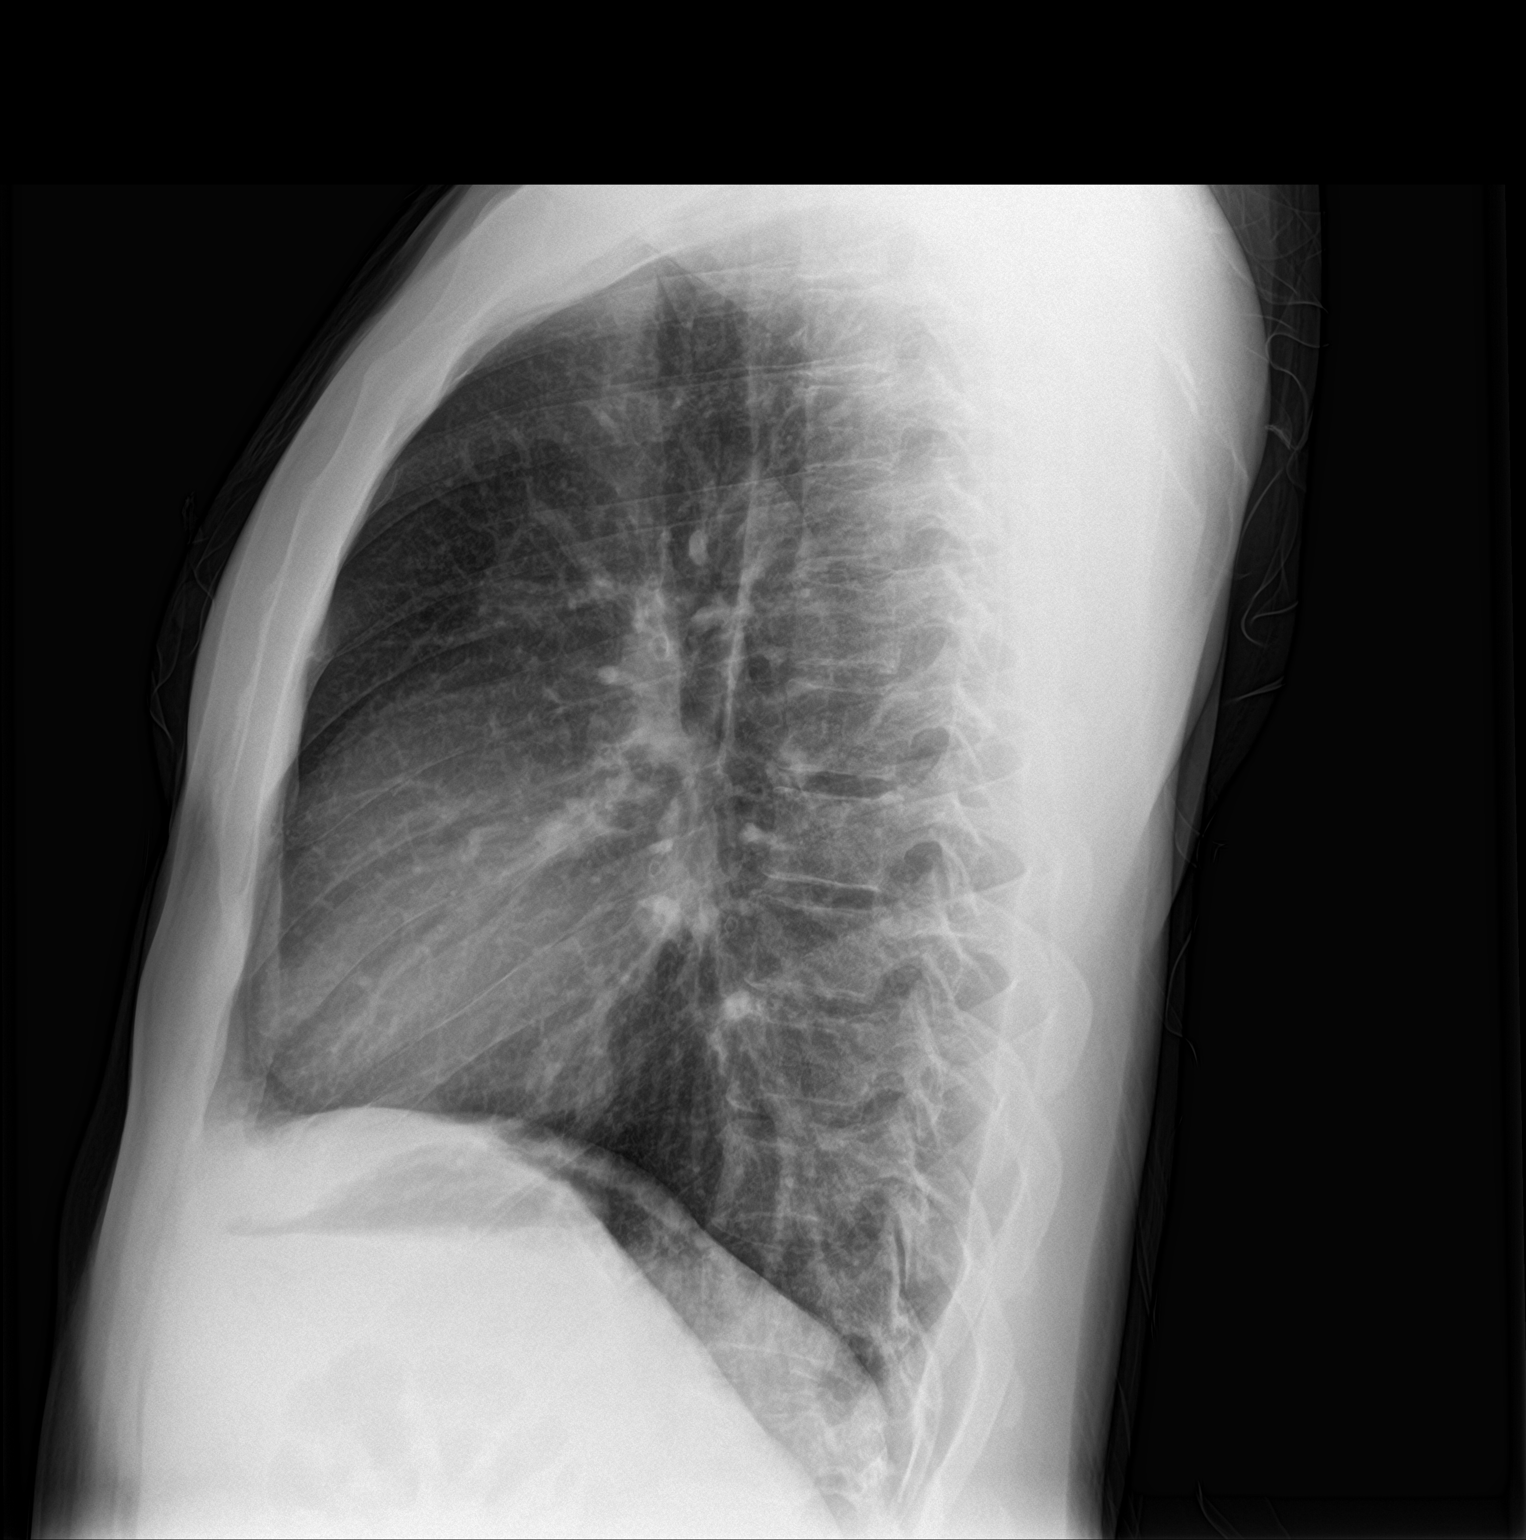

[2 of 2 positions shown; findings below may reference images not displayed]

FINDINGS: The heart size and mediastinal contours are within normal limits.
Both lungs are clear. The visualized skeletal structures are
unremarkable.
IMPRESSION: No active cardiopulmonary disease.

## 2022-08-02 IMAGING — CT CT CHEST W/ CM
4 of 8 series · 15 of 36 positions shown, 16 images · IV contrast (Omnipaque or Isovue)
Comparison: None.

CLINICAL DATA: Acute pain due to trauma.  Lumbar and sacral pain.

EXAM:
CT CHEST, ABDOMEN, AND PELVIS WITH CONTRAST
A CT THORACIC AND LUMBAR SPINE WITHOUT CONTRAST
TECHNIQUE: Multidetector CT imaging of the chest, abdomen and pelvis was
performed following the standard protocol during bolus
administration of intravenous contrast.
Multiplanar CT images of the thoracic and lumbar spine were
reconstructed from contemporary CT of the Chest, Abdomen, and Pelvis
CONTRAST:  100mL OMNIPAQUE IOHEXOL 300 MG/ML  SOLN

[Series 2: cap with · axial · 0.71mm/px · z∈[-609,+96]mm · 3 of 142 slices shown, 4 images]
[im 1/142  mediastinal]
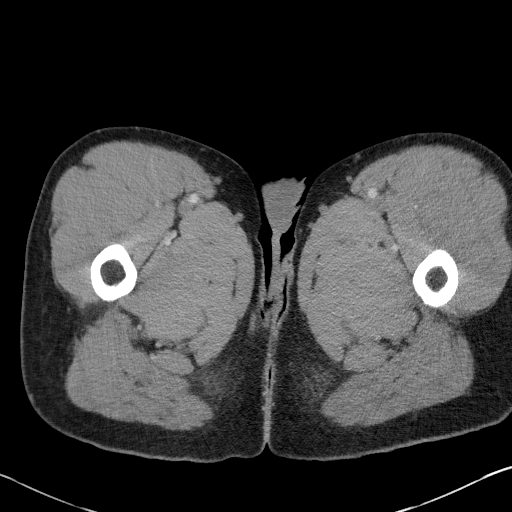
[im 1/142  lung]
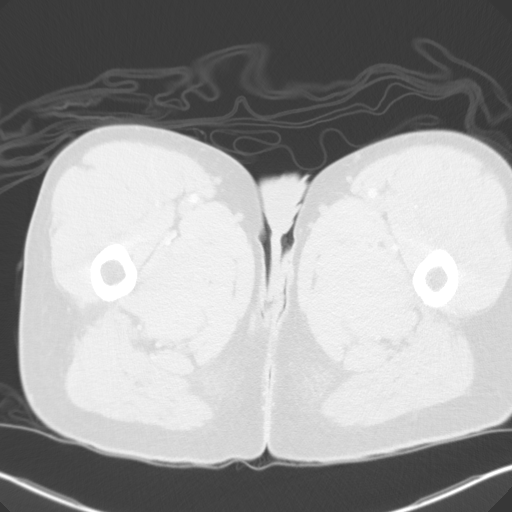
[im 71/142  lung]
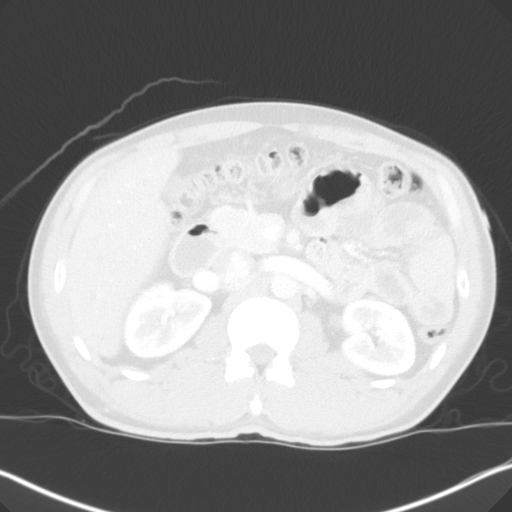
[im 142/142  lung]
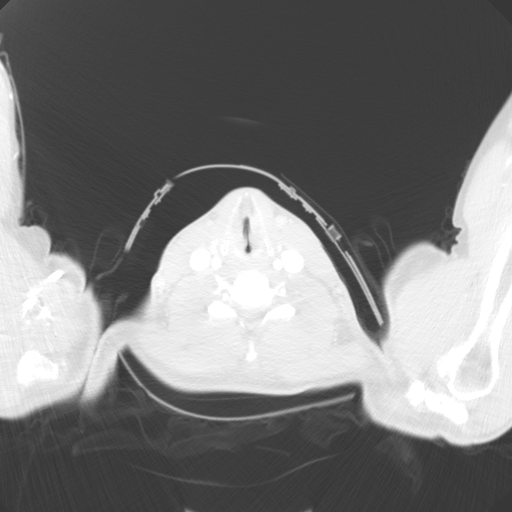

[Series 3: lung · axial · 0.71mm/px · z∈[-132,-18]mm · 2 of 171 slices shown]
[im 57/171  lung]
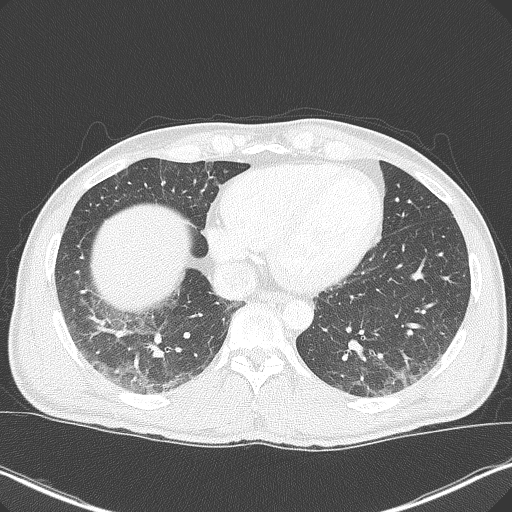
[im 114/171  lung]
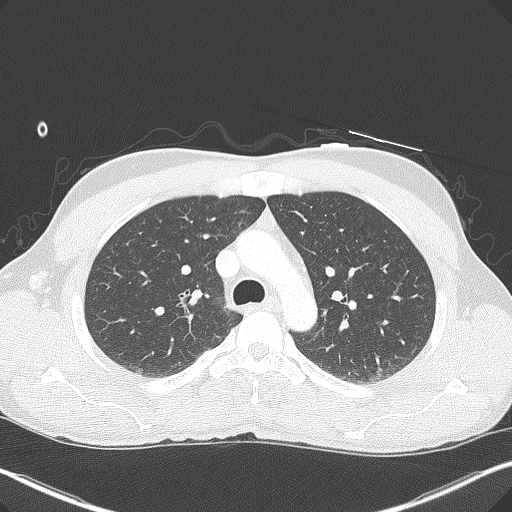

[Series 4: coronals · coronal · 0.79mm/px · 2 of 154 slices shown]
[im 52/154  lung]
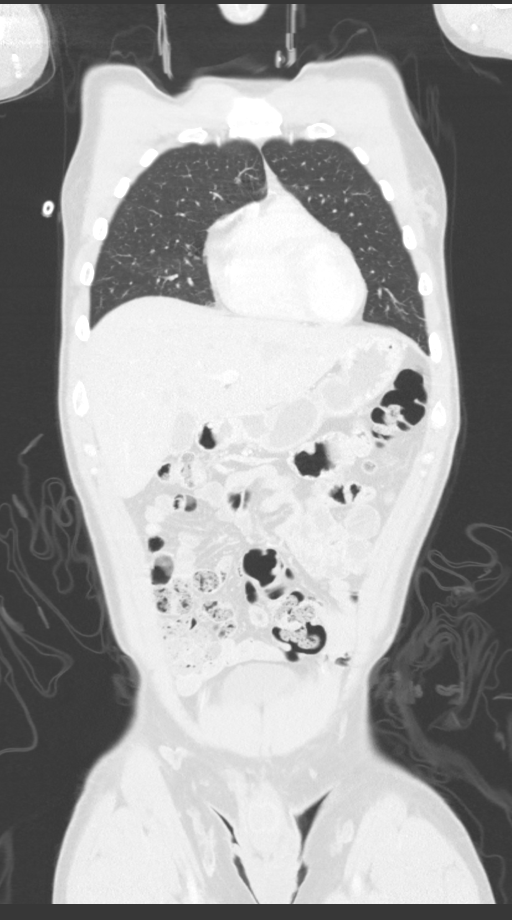
[im 103/154  lung]
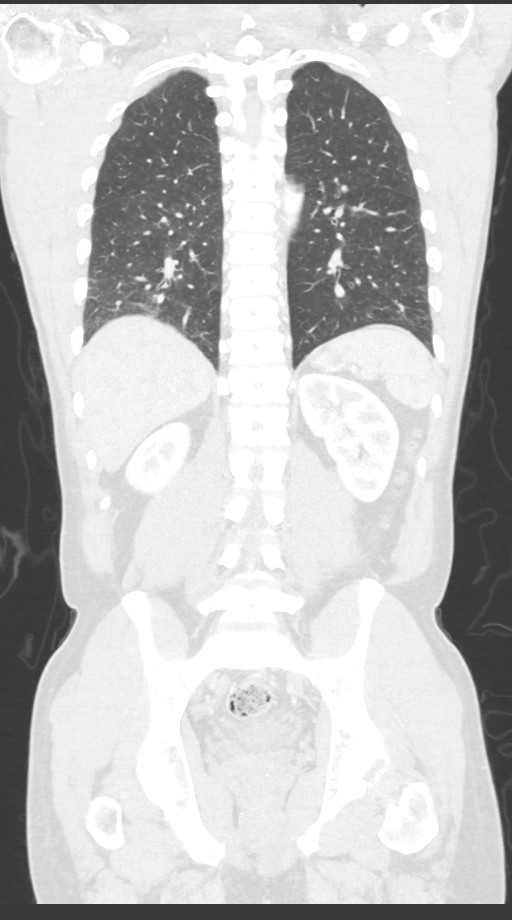

[Series 6: thins · axial · 0.84mm/px · z∈[-487,+27]mm · 8 of 932 slices shown]
[im 99/932  lung]
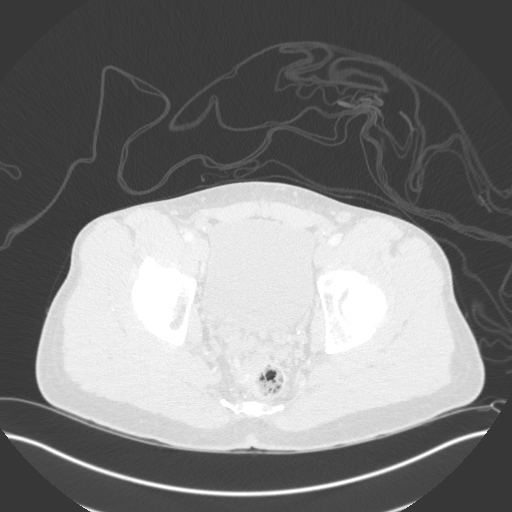
[im 197/932  lung]
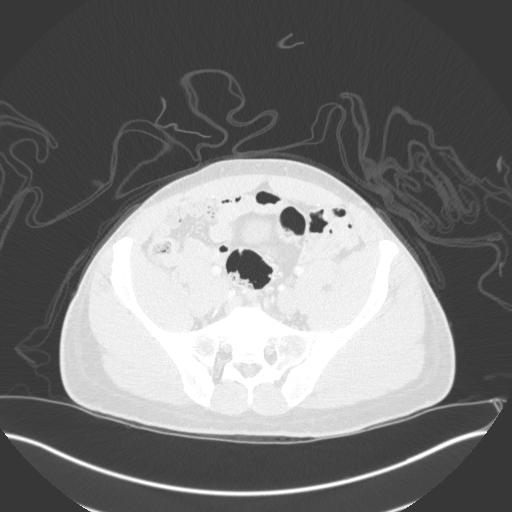
[im 295/932  lung]
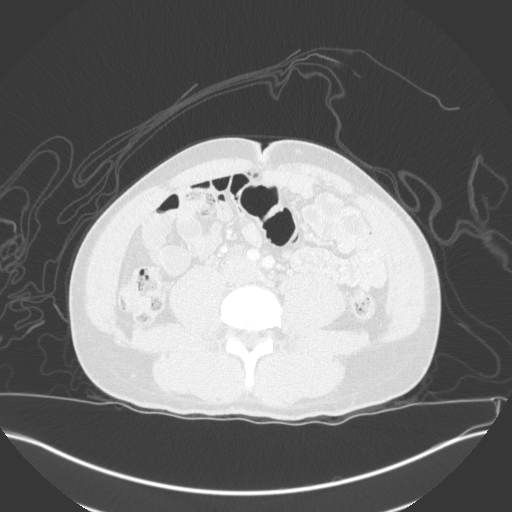
[im 393/932  lung]
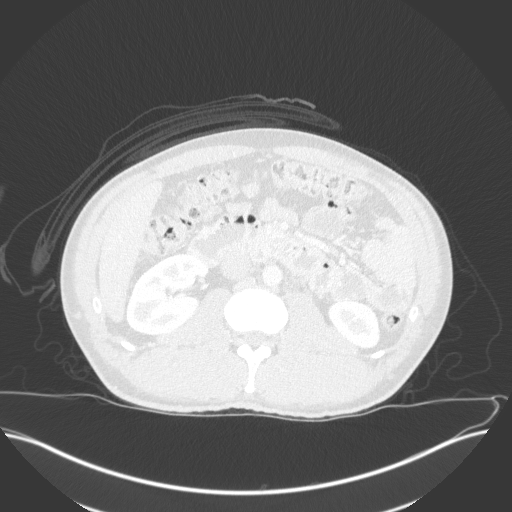
[im 540/932  lung]
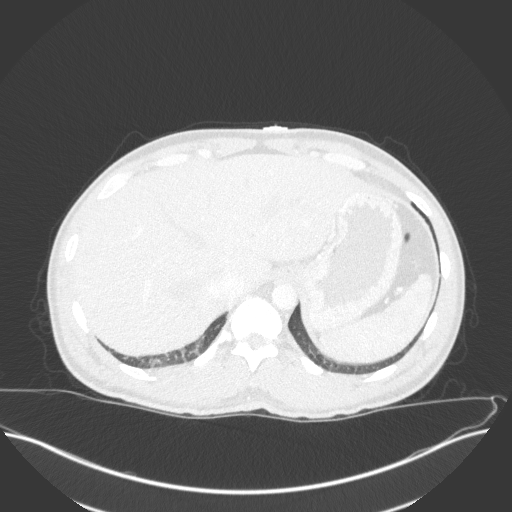
[im 638/932  lung]
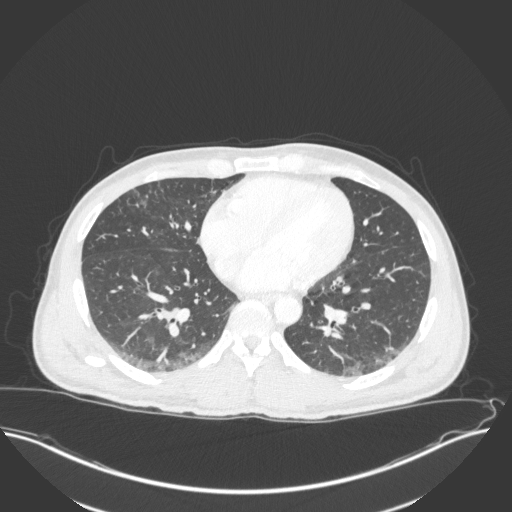
[im 736/932  lung]
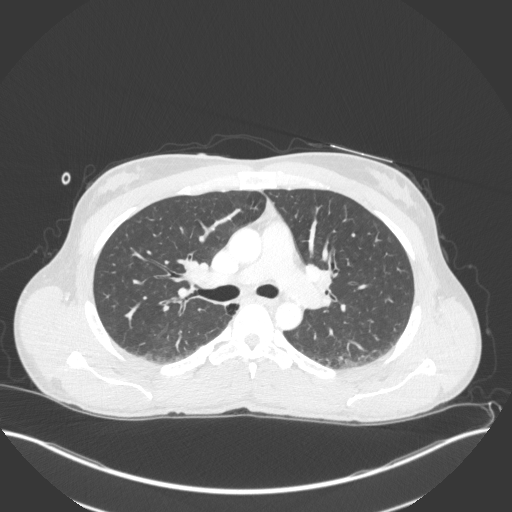
[im 834/932  lung]
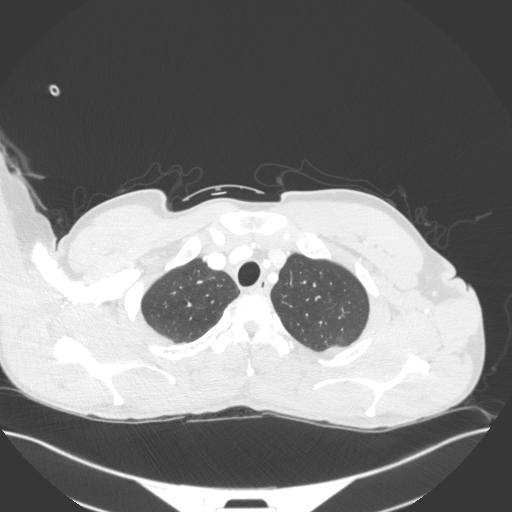

[15 of 36 positions shown; findings below may reference images not displayed]

FINDINGS: CT CHEST FINDINGS

Cardiovascular: The heart size is normal. There is no significant
pericardial effusion. No evidence for thoracic aortic aneurysm or
dissection. The arch vessels are patent. No large centrally located
pulmonary embolism.

Mediastinum/Nodes:

-- No mediastinal lymphadenopathy.

-- No hilar lymphadenopathy.

-- No axillary lymphadenopathy.

-- No supraclavicular lymphadenopathy.

-- Normal thyroid gland where visualized.

-  Unremarkable esophagus.

Lungs/Pleura: There are mixed ground-glass and reticular airspace
opacities at the lung bases bilaterally. There is no pneumothorax.
There are few small apical blebs at the right lung apex. The trachea
is unremarkable.

Musculoskeletal: There is no acute displaced fracture. There is
bilateral gynecomastia.

CT ABDOMEN PELVIS FINDINGS

Hepatobiliary: The liver is normal. Normal gallbladder.There is no
biliary ductal dilation.

Pancreas: Normal contours without ductal dilatation. No
peripancreatic fluid collection.

Spleen: Unremarkable.

Adrenals/Urinary Tract:

--Adrenal glands: Unremarkable.

--Right kidney/ureter: No hydronephrosis or radiopaque kidney
stones.

--Left kidney/ureter: No hydronephrosis or radiopaque kidney stones.

--Urinary bladder: Unremarkable.

Stomach/Bowel:

--Stomach/Duodenum: No hiatal hernia or other gastric abnormality.
Normal duodenal course and caliber.

--Small bowel: Unremarkable.

--Colon: Unremarkable.

--Appendix: Normal.

Vascular/Lymphatic: Normal course and caliber of the major abdominal
vessels.

--No retroperitoneal lymphadenopathy.

--No mesenteric lymphadenopathy.

--No pelvic or inguinal lymphadenopathy.

Reproductive: Unremarkable

Other: No ascites or free air. The abdominal wall is normal.

Musculoskeletal. No acute displaced fractures.
IMPRESSION: 1. No acute thoracic, abdominal or pelvic injury.
2. No acute fracture involving the thoracic or lumbar spine.
3. Mixed ground-glass and reticular airspace opacities at the lung
bases bilaterally. These are nonspecific and may be related to an
atypical infectious process, pneumonitis, or smoking related lung
disease.
4. Bilateral gynecomastia.
